# Patient Record
Sex: Female | Born: 1955 | Race: White | Hispanic: No | Marital: Married | Smoking: Never smoker
Health system: Southern US, Community
[De-identification: ages and names within clinical notes are randomized; demographics above are authoritative.]

## PROBLEM LIST (undated history)

## (undated) DIAGNOSIS — R011 Cardiac murmur, unspecified: Secondary | ICD-10-CM

## (undated) DIAGNOSIS — Z794 Long term (current) use of insulin: Secondary | ICD-10-CM

## (undated) DIAGNOSIS — J45909 Unspecified asthma, uncomplicated: Secondary | ICD-10-CM

## (undated) DIAGNOSIS — Z9884 Bariatric surgery status: Secondary | ICD-10-CM

## (undated) DIAGNOSIS — M1990 Unspecified osteoarthritis, unspecified site: Secondary | ICD-10-CM

## (undated) DIAGNOSIS — E119 Type 2 diabetes mellitus without complications: Secondary | ICD-10-CM

## (undated) DIAGNOSIS — R112 Nausea with vomiting, unspecified: Secondary | ICD-10-CM

## (undated) DIAGNOSIS — I10 Essential (primary) hypertension: Secondary | ICD-10-CM

## (undated) DIAGNOSIS — I8393 Asymptomatic varicose veins of bilateral lower extremities: Secondary | ICD-10-CM

## (undated) DIAGNOSIS — I341 Nonrheumatic mitral (valve) prolapse: Secondary | ICD-10-CM

## (undated) DIAGNOSIS — I872 Venous insufficiency (chronic) (peripheral): Secondary | ICD-10-CM

## (undated) DIAGNOSIS — E785 Hyperlipidemia, unspecified: Secondary | ICD-10-CM

## (undated) DIAGNOSIS — E039 Hypothyroidism, unspecified: Secondary | ICD-10-CM

## (undated) DIAGNOSIS — Z9889 Other specified postprocedural states: Secondary | ICD-10-CM

## (undated) HISTORY — DX: Nonrheumatic mitral (valve) prolapse: I34.1

## (undated) HISTORY — PX: KNEE ARTHROSCOPY: SUR90

## (undated) HISTORY — PX: TONSILLECTOMY: SUR1361

## (undated) HISTORY — PX: WISDOM TOOTH EXTRACTION: SHX21

## (undated) HISTORY — PX: ACHILLES TENDON REPAIR: SUR1153

## (undated) HISTORY — PX: CHOLECYSTECTOMY: SHX55

## (undated) HISTORY — PX: CATARACT EXTRACTION W/ INTRAOCULAR LENS  IMPLANT, BILATERAL: SHX1307

## (undated) HISTORY — DX: Cardiac murmur, unspecified: R01.1

## (undated) HISTORY — PX: COLONOSCOPY: SHX174

---

## 1986-10-20 HISTORY — PX: CARPAL TUNNEL RELEASE: SHX101

## 1998-02-20 ENCOUNTER — Other Ambulatory Visit: Admission: RE | Admit: 1998-02-20 | Discharge: 1998-02-20 | Payer: Self-pay | Admitting: Obstetrics & Gynecology

## 1998-10-20 HISTORY — PX: ROTATOR CUFF REPAIR: SHX139

## 1999-07-01 ENCOUNTER — Emergency Department (HOSPITAL_COMMUNITY): Admission: EM | Admit: 1999-07-01 | Discharge: 1999-07-01 | Payer: Self-pay | Admitting: Emergency Medicine

## 1999-07-02 ENCOUNTER — Emergency Department (HOSPITAL_COMMUNITY): Admission: EM | Admit: 1999-07-02 | Discharge: 1999-07-02 | Payer: Self-pay | Admitting: Emergency Medicine

## 1999-07-05 ENCOUNTER — Emergency Department (HOSPITAL_COMMUNITY): Admission: EM | Admit: 1999-07-05 | Discharge: 1999-07-05 | Payer: Self-pay | Admitting: Emergency Medicine

## 1999-07-08 ENCOUNTER — Emergency Department (HOSPITAL_COMMUNITY): Admission: EM | Admit: 1999-07-08 | Discharge: 1999-07-08 | Payer: Self-pay | Admitting: Emergency Medicine

## 1999-08-07 ENCOUNTER — Other Ambulatory Visit: Admission: RE | Admit: 1999-08-07 | Discharge: 1999-08-07 | Payer: Self-pay | Admitting: Obstetrics & Gynecology

## 2000-09-07 ENCOUNTER — Other Ambulatory Visit: Admission: RE | Admit: 2000-09-07 | Discharge: 2000-09-07 | Payer: Self-pay | Admitting: Obstetrics & Gynecology

## 2001-09-08 ENCOUNTER — Other Ambulatory Visit: Admission: RE | Admit: 2001-09-08 | Discharge: 2001-09-08 | Payer: Self-pay | Admitting: Obstetrics & Gynecology

## 2002-09-12 ENCOUNTER — Other Ambulatory Visit: Admission: RE | Admit: 2002-09-12 | Discharge: 2002-09-12 | Payer: Self-pay | Admitting: Obstetrics & Gynecology

## 2003-11-03 ENCOUNTER — Other Ambulatory Visit: Admission: RE | Admit: 2003-11-03 | Discharge: 2003-11-03 | Payer: Self-pay | Admitting: Obstetrics & Gynecology

## 2004-11-12 ENCOUNTER — Other Ambulatory Visit: Admission: RE | Admit: 2004-11-12 | Discharge: 2004-11-12 | Payer: Self-pay | Admitting: Obstetrics & Gynecology

## 2005-11-24 ENCOUNTER — Other Ambulatory Visit: Admission: RE | Admit: 2005-11-24 | Discharge: 2005-11-24 | Payer: Self-pay | Admitting: Obstetrics & Gynecology

## 2009-12-05 ENCOUNTER — Ambulatory Visit: Payer: Self-pay | Admitting: Sports Medicine

## 2009-12-05 DIAGNOSIS — M2170 Unequal limb length (acquired), unspecified site: Secondary | ICD-10-CM

## 2009-12-05 DIAGNOSIS — S93499A Sprain of other ligament of unspecified ankle, initial encounter: Secondary | ICD-10-CM

## 2009-12-05 DIAGNOSIS — M1710 Unilateral primary osteoarthritis, unspecified knee: Secondary | ICD-10-CM

## 2009-12-05 DIAGNOSIS — M7750 Other enthesopathy of unspecified foot: Secondary | ICD-10-CM | POA: Insufficient documentation

## 2009-12-05 DIAGNOSIS — S96819A Strain of other specified muscles and tendons at ankle and foot level, unspecified foot, initial encounter: Secondary | ICD-10-CM

## 2009-12-05 DIAGNOSIS — M216X9 Other acquired deformities of unspecified foot: Secondary | ICD-10-CM

## 2009-12-13 ENCOUNTER — Encounter (INDEPENDENT_AMBULATORY_CARE_PROVIDER_SITE_OTHER): Payer: Self-pay | Admitting: *Deleted

## 2009-12-25 ENCOUNTER — Ambulatory Visit: Payer: Self-pay | Admitting: Sports Medicine

## 2009-12-25 DIAGNOSIS — M25579 Pain in unspecified ankle and joints of unspecified foot: Secondary | ICD-10-CM

## 2010-01-23 ENCOUNTER — Ambulatory Visit: Payer: Self-pay | Admitting: Sports Medicine

## 2010-01-24 ENCOUNTER — Encounter (INDEPENDENT_AMBULATORY_CARE_PROVIDER_SITE_OTHER): Payer: Self-pay | Admitting: *Deleted

## 2010-01-28 ENCOUNTER — Encounter (INDEPENDENT_AMBULATORY_CARE_PROVIDER_SITE_OTHER): Payer: Self-pay | Admitting: *Deleted

## 2010-01-28 ENCOUNTER — Ambulatory Visit: Payer: Self-pay | Admitting: Internal Medicine

## 2010-02-04 ENCOUNTER — Ambulatory Visit: Payer: Self-pay | Admitting: Internal Medicine

## 2010-02-04 HISTORY — PX: COLONOSCOPY: SHX174

## 2010-02-21 ENCOUNTER — Ambulatory Visit: Payer: Self-pay | Admitting: Sports Medicine

## 2010-09-19 ENCOUNTER — Ambulatory Visit: Payer: Self-pay | Admitting: Sports Medicine

## 2010-09-19 DIAGNOSIS — M25529 Pain in unspecified elbow: Secondary | ICD-10-CM

## 2010-09-19 DIAGNOSIS — M7020 Olecranon bursitis, unspecified elbow: Secondary | ICD-10-CM

## 2010-10-21 ENCOUNTER — Ambulatory Visit: Payer: Self-pay | Admitting: Sports Medicine

## 2010-11-21 NOTE — Letter (Signed)
Summary: [facility] Instructions  [facility]  [address] [place], [place] [address]   Phone: [phone]  Fax: [phone]       Rebecca Camacho    12/08/1955    MRN: [id number]        Procedure Day /Date:  Monday 02/04/10     Arrival Time:  9:30am     Procedure Time:  10:30am     Location of Procedure:                    _X _  [facility] (4th Floor)                        PREPARATION FOR COLONOSCOPY WITH MOVIPREP   Starting 5 days prior to your procedure  Wednesday 04/13  do not eat nuts, seeds, popcorn, corn, beans, peas,  salads, or any raw vegetables.  Do not take any fiber supplements (e.g. Metamucil, Citrucel, and Benefiber).  THE DAY BEFORE YOUR PROCEDURE         DATE:  04/17   DAY:  Sunday  1.  Drink clear liquids the entire day-NO SOLID FOOD  2.  Do not drink anything colored red or purple.  Avoid juices with pulp.  No orange juice.  3.  Drink at least 64 oz. (8 glasses) of fluid/clear liquids during the day to prevent dehydration and help the prep work efficiently.  CLEAR LIQUIDS INCLUDE: Water Jello Ice Popsicles Tea (sugar ok, no milk/cream) Powdered fruit flavored drinks Coffee (sugar ok, no milk/cream) Gatorade Juice: apple, white grape, white cranberry  Lemonade Clear bullion, consomm, broth Carbonated beverages (any kind) Strained chicken noodle soup Hard Candy                             4.  In the morning, mix first dose of MoviPrep solution:    Empty 1 Pouch A and 1 Pouch B into the disposable container    Add lukewarm drinking water to the top line of the container. Mix to dissolve    Refrigerate (mixed solution should be used within 24 hrs)  5.  Begin drinking the prep at 5:00 p.m. The MoviPrep container is divided by 4 marks.   Every 15 minutes drink the solution down to the next mark (approximately 8 oz) until the full liter is complete.   6.  Follow completed prep with 16 oz of clear liquid of your  choice (Nothing red or purple).  Continue to drink clear liquids until bedtime.  7.  Before going to bed, mix second dose of MoviPrep solution:    Empty 1 Pouch A and 1 Pouch B into the disposable container    Add lukewarm drinking water to the top line of the container. Mix to dissolve    Refrigerate  THE DAY OF YOUR PROCEDURE      DATE:  04/18  DAY: Monday  Beginning at  5:30 a.m. (5 hours before procedure):         1. Every 15 minutes, drink the solution down to the next mark (approx 8 oz) until the full liter is complete.  2. Follow completed prep with 16 oz. of clear liquid of your choice.    3. You may drink clear liquids until  8:30am (2 HOURS BEFORE PROCEDURE).   MEDICATION INSTRUCTIONS  Unless otherwise instructed, you should take regular prescription medications with a small sip of  water   as early as possible the morning of your procedure.  Diabetic patients - see separate instructions.  Additional medication instructions: check your blood sugar often as needed         OTHER INSTRUCTIONS  You will need a responsible adult at least 57 years of age to accompany you and drive you home.   This person must remain in the waiting room during your procedure.  Wear loose fitting clothing that is easily removed.  Leave jewelry and other valuables at home.  However, you may wish to bring a book to read or  an iPod/MP3 player to listen to music as you wait for your procedure to start.  Remove all body piercing jewelry and leave at home.  Total time from sign-in until discharge is approximately 2-3 hours.  You should go home directly after your procedure and rest.  You can resume normal activities the  day after your procedure.  The day of your procedure you should not:   Drive   Make legal decisions   Operate machinery   Drink alcohol   Return to work  You will receive specific instructions about eating, activities and medications before you  leave.    The above instructions have been reviewed and explained to me by  Sherren Kerns RN  January 28, 2010 2:58 PM    I fully understand and can verbalize these instructions _____________________________ Date _________

## 2010-11-21 NOTE — Assessment & Plan Note (Signed)
Summary: F/U FEET PAIN,MC   Vital Signs:  Patient profile:   55 year old female BP sitting:   159 / 71  Vitals Entered By: Lillia Pauls CMA (December 25, 2009 2:37 PM)  CC:  f/u right foot pain.  History of Present Illness: Pt with hx of severe right knee DJD and bilateral achilles repairs, here for:  1.  right foot pain lateral  and plantar foot along peroneal distribution worse with prolonged standing and walking.  also worse on any uneven surface, such as walking over a threshold or steppping on a rock seen 2/18 and given a heel lift has been wearing heel lift also wears don joy brace on her knee when she is going to do any prolonged standing or walking not really any improvement since her last visit.       Allergies: No Known Drug Allergies  Review of Systems General:  Denies weakness. MS:  Denies loss of strength and stiffness.  Physical Exam  General:  Well-developed,well-nourished,overweight, in no acute distress; alert,appropriate and cooperative throughout examination Msk:  gait--functionally shorter right leg due to inability to extend right knee fully; pronation of right foot.  lifts 3rd and 4th toes up.    ANKLES/FEET: clawed toes on left ankles with from 4/5 right eversion strength vs 5/5 on left. Mild ttp distal right peroneal tendon.no swelling.  u/s:  very large achilles tendon on right bone spur at right 5th MT base with evidence of old avulsion fracture at attachment of peroneal tendon no bone spur at left mt base          Impression & Recommendations:  Problem # 1:  OTHER ACQUIRED DEFORMITY OF ANKLE AND FOOT OTHER (ICD-736.79) Assessment Unchanged  sizeable bone spur at 5th MT base probably due to old avulsion fracture.  Very large achilles tendon.  likely overusing peroneal tendons to compensate for weak achilles. fitted with air cast to help take pressure off of lateral right foot shown exercises to strengthen achilles, which should  help take some pressure off of lateral foot and peroneal tendon rtc 4 weeks  Orders: [place] LIMITED ([id number])  Problem # 2:  ACHILLES TENDON TEAR (ICD-845.09)  Orders: Airsport brace (J8119)  This is intact by [place] but probably alters her gait as it is markedly thickened on RT and LT On RT it is 1.37 cms  Problem # 3:  ANKLE PAIN (ICD-719.47)  Orders: Airsport brace (J4782) [place] LIMITED ([id number])  This is more clearly now related to old avulsion fx of RT foot and ankle that gives her peroneal sxs along lat ankle  Problem # 4:  DEGENERATIVE JOINT DISEASE, RIGHT KNEE (ICD-715.96) This is severe  at some point will need TKR  Patient Instructions: 1)  It was nice to see you today. 2)  Wear the brace we gave you. 3)  Do the exercises Dr Darrick Penna showed you (stand on a book and move heels up and down). 4)  Ice your foot at the end of the day. 5)  Please schedule a follow-up appointment in 4 weeks.

## 2010-11-21 NOTE — Miscellaneous (Signed)
Summary: previsit/rm  Clinical Lists Changes  Medications: Added new medication of MOVIPREP 100 GM  SOLR (PEG-KCL-NACL-NASULF-NA ASC-C) As per prep instructions. - Signed Rx of MOVIPREP 100 GM  SOLR (PEG-KCL-NACL-NASULF-NA ASC-C) As per prep instructions.;  #1 x 0;  Signed;  Entered by: Sherren Kerns RN;  Authorized by: Hilarie Fredrickson MD;  Method used: [facility] to [facility] [phone]*, [address], [place], [place], [place]  [id number], Ph: [phone] or [phone], Fax: [phone] Observations: Added new observation of ALLERGY REV: Done (01/28/2010 14:34)    Prescriptions: MOVIPREP 100 GM  SOLR (PEG-KCL-NACL-NASULF-NA ASC-C) As per prep instructions.  #1 x 0   Entered by:   Sherren Kerns RN   Authorized by:   Hilarie Fredrickson MD   Signed by:   Sherren Kerns RN on 01/28/2010   Method used:   [facility] to        [facility] [phone]* (retail)       [address]       [place], [place]  [id number]       Ph: [phone] or [phone]       Fax: [phone]   RxID:   [id number]

## 2010-11-21 NOTE — Assessment & Plan Note (Signed)
Summary: f/u,mc   Vital Signs:  Patient profile:   55 year old female BP sitting:   165 / 81  Vitals Entered By: Lillia Pauls CMA (Feb 21, 2010 2:02 PM)  History of Present Illness: Pt presents for follow-up for her right peroneal tendon injury. She has been wearing her aircast when she is out and about which has significantly decreased her pain. She is not having pain when she walks around at home without the aircast. She works at [employer] and does where it the entire time there because of the hard floors. She continues to take the Naproxen without any side effects. The Naproxen is helpful.  She has noticed some throbbing at night where the distal peroneal tendon inserts onto the 5th MT intermittently. She has not been icing or doing her exercises on a daily basis. However, overall she is doing much better than she did at her last office visit since using the aircast.   Allergies: No Known Drug Allergies  Physical Exam  General:  alert and well-developed.   Head:  normocephalic and atraumatic.   Neck:  supple.   Lungs:  normal respiratory effort.   Msk:  Right Ankle and Foot: No bony abnormalities, edema or bruising Full ROM without pain  Mild TTP at the base of the 5th MT No other TTP noticed 5/5 strength with resisted ankle ROM testing Able to bear weight easily  Left Ankle and Foot: No bony abnormalities, edema or bruising Full ROM without pain  No TTP throughout 5/5 strength with resisted ankle ROM testing Able to bear weight easily  Neurovascularly intact Additional Exam:  U/S of the right 5th MT shows a healing avulsion fracture at the side the of the insertion of the peroneal tendon into the base of the 5th MT. There is bone callus formation. Decreased doppler flow. Mild edema noted. No other fractures or abnormalities noted. Images saved for documentation.     Impression & Recommendations:  Problem # 1:  ANKLE PAIN (ICD-719.47)  Due to peroneal tendon  avulsion 1. Start to wean out of aircast as directed in patient instructions 2. Wean off Naproxen as tolerated 3. Return in 3-4 weeks for follow-up of fracture 4. Continue exercises and icing as previously directed  Orders: [place] LIMITED ([id number])  Complete Medication List: 1)  Moviprep 100 Gm Solr (Peg-kcl-nacl-nasulf-na asc-c) .... As per prep instructions.  Patient Instructions: 1)  Use your aircast when you are out and about and especially at work for the next month. However, start to wean yourself out of the brace. 2)  Start with one hour while outside and gradually increase by one hour every 2-3 days as tolerated. However, if your pain increases or your develop more throbbing, put your aircast back on more often.  3)  Continue the daily exercises for your calf muscle and ankle. 4)  Return in one month

## 2010-11-21 NOTE — Letter (Signed)
Summary: Previsit letter  [facility]  [address] [place], [place] [address]   Phone: [phone]  Fax: [phone]       12/13/2011 MRN: [id number]  Rebecca Camacho [address] [place], [place]  [address]  Dear [name],  Welcome to the Gastroenterology Division at [facility].    You are scheduled to see a nurse for your pre-procedure visit on 01-28-10 at 2:30p.m. on the 3rd floor at [facility], [address]  We ask that you try to arrive at our office 15 minutes prior to your appointment time to allow for check-in.  Your nurse visit will consist of discussing your medical and surgical history, your immediate family medical history, and your medications.    Please bring a complete list of all your medications or, if you prefer, bring the medication bottles and we will list them.  We will need to be aware of both prescribed and over the counter drugs.  We will need to know exact dosage information as well.  If you are on blood thinners (Coumadin, Plavix, Aggrenox, Ticlid, etc.) please call our office today/prior to your appointment, as we need to consult with your physician about holding your medication.   Please be prepared to read and sign documents such as consent forms, a financial agreement, and acknowledgement forms.  If necessary, and with your consent, a friend or relative is welcome to sit-in on the nurse visit with you.  Please bring your insurance card so that we may make a copy of it.  If your insurance requires a referral to see a specialist, please bring your referral form from your primary care physician.  No co-pay is required for this nurse visit.     If you cannot keep your appointment, please call [phone] to cancel or reschedule prior to your appointment date.  This allows [place] the opportunity to schedule an appointment for another patient in need of care.    Thank you for choosing [facility] for your  medical needs.  We appreciate the opportunity to care for you.  Please visit [place] at our website  to learn more about our practice.                     Sincerely.                                                                                                                   The Gastroenterology Division

## 2010-11-21 NOTE — Assessment & Plan Note (Signed)
Summary: FALL ON 11/25 - ELBOW PAIN/MJD   Vital Signs:  Patient profile:   55 year old female Height:      64 inches Weight:      220 pounds BMI:     37.90 BP sitting:   174 / 92  Vitals Entered By: Lillia Pauls CMA (September 19, 2010 2:36 PM)   History of Present Illness: 55 yo female, slipped and hit L elbow 9d ago.  Went to [facility], had XR that did not show fx.  Since then has tried naproxen with minimal benefit in her pain.  She has good strength to all movements but notes that she cannot get the elbow completely straight and has pain with activities that require wrist/finger flexion as well as pronation.  She has swelling and bruising.  No numbness/tingling in hand.    Preventive Screening-Counseling & Management  Alcohol-Tobacco     Smoking Status: never  Current Medications (verified): 1)  Mobic 7.5 Mg Tabs (Meloxicam) .... One To Two Tabs By Mouth Daily For Pain  Allergies (verified): No Known Drug Allergies  Social History: Smoking Status:  never  Review of Systems       SEe HPI  Physical Exam  General:  Well-developed,well-nourished,in no acute distress; alert,appropriate and cooperative throughout examination Msk:  Left elbow with bruising noted posteromedial from mid arm down to mid forearm.  Swelling over olecranon and medial epicondyle.  ROM approx 5 deg extension to 130 deg flexion.  Pronation/supination full ROM.  Strength full to flexion/extension, supination/pronation.  NVI distally.    Additional Exam:  MSK [place]: Images of medial epicondyle and olecranon taken.  Significant tissue edema seen in triceps tendon with traumatic olecranon bursitis.  Small avulsion fragment seen in triceps tendon adjacent to olecranon.  Medial epicondyle also with significant tissue edema.  Another avulsion fx fragment seen near this structure.  Common flexor origin tendons as well as triceps tendon appear intact. Images saved.   Impression & Recommendations:  Problem #  1:  ELBOW PAIN, LEFT (ICD-719.42) Assessment New  Symptoms and images suggestive of contusion, traumatic olecranon bursitis, and traumatic avulsion fracture of olecranon and medial epicondyle, likely too small to be seen on Xray. Recommend:  -elbow sleeve to be work during the day. -Ice massage 20 mins three times a day. -Change naproxen to Mobic. -ROM exercises with attempted full elbow extension 5x a day. -RTC 1 month to see how she's doing.  Orders: Garment,belt,sleeve or other covering ,elastic or similar stretch (Z6109) [place] LIMITED ([id number])  Problem # 2:  OLECRANON BURSITIS, LEFT (ICD-726.33)  This is post traumatic would not inject in face of avulsion fxs at this point if still painful in month - rescan and consider injection  Orders: [place] LIMITED (09811)  Complete Medication List: 1)  Mobic 7.5 Mg Tabs (Meloxicam) .... One to two tabs by mouth daily for pain  Patient Instructions: 1)  Elbow sleeve to be worn during the day. 2)  Mobic for pain, stop your naproxen for now.  3)  Range of motion exercises (stretch to full extension/straight elbow  ~5x a day) 4)  Come back to see [place] in 1 month to see how you are doing. 5)  -Dr. Karie Schwalbe. Prescriptions: MOBIC 7.5 MG TABS (MELOXICAM) One to two tabs by mouth daily for pain  #30 x 0   Entered by:   Rodney Langton MD   Authorized by:   Enid Baas MD   Signed by:   Rodney Langton MD  on 09/19/2010   Method used:   Print then Give to Patient   RxID:   [id number]    Orders Added: 1)  Garment,belt,sleeve or other covering ,elastic or similar stretch [A4466] 2)  [place] LIMITED [[id number]] 3)  Est. Patient Level III [14782]

## 2010-11-21 NOTE — Letter (Signed)
Summary: Diabetic Instructions  [facility]  [address] [place], [place] [address]   Phone: [phone]  Fax: [phone]    Rebecca Camacho 12-21-1955 MRN: [id number]   _x _   ORAL DIABETIC MEDICATION INSTRUCTIONS  The day before your procedure:   Take your diabetic pill as you do normally  The day of your procedure:   Do not take your diabetic pill    We will check your blood sugar levels during the admission process and again in Recovery before discharging you home  ________________________________________________________________________  _x _   INSULIN (LONG ACTING) MEDICATION INSTRUCTIONS (Lantus, NPH, 70/30, Humulin, Novolin-N)   The day before your procedure:   Take  your regular evening dose    The day of your procedure:   Do not take your morning dose    _x  _   INSULIN (SHORT ACTING) MEDICATION INSTRUCTIONS (Regular, Humulog, Novolog)   The day before your procedure:   Do not take your evening dose   The day of your procedure:   Do not take your morning dose

## 2010-11-21 NOTE — Procedures (Signed)
Summary: Colonoscopy  Patient: Rebecca Camacho Note: All result statuses are Final unless otherwise noted.  Tests: (1) Colonoscopy (COL)   COL Colonoscopy           DONE     [facility]     [address]     [place], [place]  [address]           COLONOSCOPY PROCEDURE REPORT           PATIENT:  Rebecca Camacho, Rebecca Camacho  MR#:  [id number]     BIRTHDATE:  Oct 07, 1956, 53 yrs. old  GENDER:  female     ENDOSCOPIST:  Wilhemina Bonito. Eda Keys, MD     REF. BY:  Jarome Matin, M.D.     PROCEDURE DATE:  02/04/2010     PROCEDURE:  Average-risk screening colonoscopy     G0121     ASA CLASS:  Class II     INDICATIONS:  Routine Risk Screening     MEDICATIONS:   Fentanyl 100 mcg IV, Versed 10 mg IV, Benadryl 25     mg IV           DESCRIPTION OF PROCEDURE:   After the risks benefits and     alternatives of the procedure were thoroughly explained, informed     consent was obtained.  Digital rectal exam was performed and     revealed no abnormalities.   The LB CF-[id number] endoscope     was introduced through the anus and advanced to the cecum, which     was identified by both the appendix and ileocecal valve, without     limitations.Time to cecum = 5:55 min.The quality of the prep was     excellent, using MoviPrep.  The instrument was then slowly     withdrawn (time = 12:57 min) as the colon was fully examined.     <<PROCEDUREIMAGES>>           FINDINGS:  Mild diverticulosis was found in the sigmoid colon.     This was otherwise a normal examination of the colon.  No polyps or     cancers were seen.   Retroflexed views in the rectum revealed no     abnormalities.    The scope was then withdrawn from the patient     and the procedure completed.           COMPLICATIONS:  None     ENDOSCOPIC IMPRESSION:     1) Mild diverticulosis in the sigmoid colon     2) Otherwise normal examination     3) No polyps or cancers     RECOMMENDATIONS:     1) Continue current colorectal screening  recommendations for     "routine risk" patients with a repeat colonoscopy in 10 years.           ______________________________     Wilhemina Bonito. Eda Keys, MD           CC:  Jarome Matin, MD;The Patient           n.     Rosalie DoctorWilhemina Bonito. Eda Keys at 02/04/2010 11:32 AM           [place], [place], [id number]  Note: An exclamation mark (!) indicates a result that was not dispersed into the flowsheet. Document Creation Date: 02/04/2010 11:33 AM _______________________________________________________________________  (1) Order result status: Final Collection or observation date-time: 02/04/2010 11:27 Requested date-time:  Receipt date-time:  Reported date-time:  Referring Physician:  Ordering Physician: Fransico Setters [id number]) Specimen Source:  Source: Launa Grill Order Number: [id number] Lab site:   Appended Document: Colonoscopy    Clinical Lists Changes  Observations: Added new observation of COLONNXTDUE: 01/2020 (02/04/2010 12:59)

## 2010-11-21 NOTE — Assessment & Plan Note (Signed)
Summary: NP FOOT PAIN/MJD   Vital Signs:  Patient profile:   55 year old female Height:      64 inches Weight:      210 pounds BMI:     36.18 BP sitting:   146 / 87  Vitals Entered By: Lillia Pauls CMA (December 05, 2009 11:40 AM)  History of Present Illness: 55 y.o. female with significant right knee DJD c/o right foot pain along distal peroneal distribution. Insidious onset in 08/2009. Worst on prolonged ambulation and eversion. Partly relieved by naproxen and rest. No swelling/numbness/tingling/discoloration. No ankle/foot trauma. Attributes pain to suspect change in gait following her right knee/achilles surgeries in 04/2009.  Severe right knee DJD s/p right knee scope in 04/2009. Limited extension and flexion which have not significantly improvement. Occasional buckling 2/2 pain. No locking/catching/swelling. Wears Don Joy brace which helps increase knee support. Received corticosteroid injection which helped decrease her pain. Wants to avoid TKA for as long as possible.        Allergies (verified): No Known Drug Allergies  Past History:  Past Surgical History: Repair of partial right AT tear performed by Dr. Lajoyce Corners in 04/2009. Right knee arthroscopy performed by Dr. Sherlean Foot in 04/2009.  Physical Exam  General:  Well-developed,well-nourished,in no acute distress; alert,appropriate and cooperative throughout examination Msk:  HIPS: FROM. 4/5 right flex/add/ER/abd strength. (+) right trendelenburg.  walking gait shows that right functions like a short leg due to her inability to get normal extension  KNEES: Right- Ext 10 deg deficit, Flex 100. 4/5 ext/flex. Left - Ext full, Flex 110-120. 5/5 ext/fex.  ANKLES/FEET: Significantly splayed 1st/2nd toes. Diffuse claw toeing on the right. Supinated 4th/5th toes bilaterally. Callous formation on balls of feet. Mild gross limitation on ROM testing of toes.  FROM of ankles. 4/5 right eversion strength vs 5/5  on left. Mild ttp distal right peroneal tendon. No bony ttp. No swelling/discoloration.  Excessive pronation (R>L) worsened on ambulation; controlled on insertion of comforthotics with right felt heel lift.        Pulses:  2+ pt/dp pulses. Extremities:  Functional leg length discrepancy as RLE  ~0.5cm shorter than LLE.  Relative ER of the RLE, most notable at the ankle & foot. Neurologic:  Sensation intact.   Impression & Recommendations:  Problem # 1:  OTHER ENTHESOPATHY OF ANKLE AND TARSUS (ICD-726.79)  - Comforthotics with right felt heel lift. - Continue to use DonJoy brace for ambulatory activities. - Hip strengthening exercises, without weights, as tolerated. - RTC as needed for persistent pain or other concerns.  This does lessen her trendelenburg and improve her walking in clinic to add the foot lift  Problem # 2:  DEGENERATIVE JOINT DISEASE, RIGHT KNEE (UEA-540.98) Mrs. Libman would like to avoid TKA for as long as possible. cont naprosyna nd tylenol prn  - Per item #1. - Continue aquatic exercises as tolerated.  Problem # 3:  UNEQUAL LEG LENGTH (ICD-736.81) Functional discrepancy 2/2 significant right knee DJD.  - Per item # 1.  Problem # 4:  ACHILLES TENDON TEAR (ICD-845.09)  - Maintain followup with Dr. Lajoyce Corners.

## 2010-11-21 NOTE — Assessment & Plan Note (Signed)
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   Msk:  RT ankle shows no swelling; stable lateral and medial ligaments; squeeze test and kleiger test unremarkable; talar dome seems nontender; no sign of peroneal tendon subluxations;  Now with light pressure there is no pain at base of 5th MT. able to do resistance exercises for peroneal MM sans pain  Bilat AT are thick w post surg changes neither are tender today   Impression & Recommendations:  Problem # 1:  OTHER ENTHESOPATHY OF ANKLE AND TARSUS (ICD-726.79) There is an avulsion fx at base of 5th but this does not appear tender today peroneal swelling is gone today and no real pain on testing  cont to sue brace over next mo  exercises started today for ankle and peroneals  reck in 1 mo and rescan at that time  Problem # 2:  ACHILLES TENDON TEAR (ICD-845.09) keep up AT exercises as perscribed  doing well at this point and gaining strength

## 2010-11-21 NOTE — Assessment & Plan Note (Signed)
Summary: f/u elbow,mc   Vital Signs:  Patient profile:   55 year old female BP sitting:   145 / 74  Vitals Entered By: Lillia Pauls CMA (October 21, 2010 11:50 AM)  History of Present Illness: Pt reports for f/u of lt elbow injury, which she reports is 50% improved.  Still has some slight soreness.  Swimming for exercise- some tightness with this.  Using naproxen daily for knee pain, and taking tylenol at night if elbow is sore- helpful for pain. meloxicam bothered her stomach  not doing any weight exercise yet  Allergies: No Known Drug Allergies  Physical Exam  General:  Well-developed,well-nourished,in no acute distress; alert,appropriate and cooperative throughout examination Msk:  Lacks 5 degrees full elbow extension on lt Full flexion bilat Full rotation both elbows bilat Good biceps strength bilat Triceps testing on L causes some pain  Additional Exam:  MSK [place] There is still fluid in olecranon bursa this is septated and in 3 sections note this is decreased from before avulsion fragment noted with no extensive swelling at insertin of tri tendon tendon intact   Impression & Recommendations:  Problem # 1:  ELBOW PAIN, LEFT (ICD-719.42)  Pain is much less  OK to cont on Naprosyn as needed  use compression sleeve ice at end of day  begin rehab exercises  Orders: [place] LIMITED ([id number])  Problem # 2:  OLECRANON BURSITIS, LEFT (ICD-726.33)  will use compression  will only inject if sxs get worse  reck 1 mo  Orders: [place] LIMITED ([id number])  Complete Medication List: 1)  Mobic 7.5 Mg Tabs (Meloxicam) .... One to two tabs by mouth daily for pain  Patient Instructions: 1)  Holding 1 pound weight do elbow extensions 6-10 reps, 3 sets- twice daily 2)  Wrist rolls 6-10 reps, 3 sets- twice daily  3)  swimming is ok 4)  Use compression sleeve with activity   Orders Added: 1)  Est. Patient Level III [29528] 2)  [place] LIMITED [[id number]]

## 2010-11-25 ENCOUNTER — Ambulatory Visit: Payer: Self-pay | Admitting: Sports Medicine

## 2012-07-19 DIAGNOSIS — I10 Essential (primary) hypertension: Secondary | ICD-10-CM | POA: Insufficient documentation

## 2012-08-20 DIAGNOSIS — Z9884 Bariatric surgery status: Secondary | ICD-10-CM

## 2012-08-20 HISTORY — DX: Bariatric surgery status: Z98.84

## 2012-08-20 HISTORY — PX: GASTRIC BYPASS: SHX52

## 2013-02-25 ENCOUNTER — Other Ambulatory Visit: Payer: Self-pay

## 2013-02-25 DIAGNOSIS — I83893 Varicose veins of bilateral lower extremities with other complications: Secondary | ICD-10-CM

## 2013-04-19 ENCOUNTER — Encounter: Payer: Self-pay | Admitting: Vascular Surgery

## 2013-04-26 ENCOUNTER — Encounter: Payer: Self-pay | Admitting: Vascular Surgery

## 2013-05-10 ENCOUNTER — Encounter: Payer: Self-pay | Admitting: Vascular Surgery

## 2013-05-11 ENCOUNTER — Encounter (INDEPENDENT_AMBULATORY_CARE_PROVIDER_SITE_OTHER): Payer: BC Managed Care – PPO | Admitting: *Deleted

## 2013-05-11 ENCOUNTER — Encounter: Payer: Self-pay | Admitting: Vascular Surgery

## 2013-05-11 ENCOUNTER — Ambulatory Visit (INDEPENDENT_AMBULATORY_CARE_PROVIDER_SITE_OTHER): Payer: BC Managed Care – PPO | Admitting: Vascular Surgery

## 2013-05-11 VITALS — BP 176/66 | HR 52 | Ht 64.0 in | Wt 139.7 lb

## 2013-05-11 DIAGNOSIS — I83893 Varicose veins of bilateral lower extremities with other complications: Secondary | ICD-10-CM

## 2013-05-11 DIAGNOSIS — Z01810 Encounter for preprocedural cardiovascular examination: Secondary | ICD-10-CM

## 2013-05-11 NOTE — Progress Notes (Signed)
[facility] [place]  Patient name: Rebecca Camacho MRN: [id number] DOB: 09-03-1956 Sex: female  REASON FOR CONSULT: evaluate painful varicose veins of both lower extremities.  HPI: [place] is a 57 y.o. female who has a long history of varicose veins. She's had pain in both lower extremities for many years related to her varicose veins. Did have some sclerotherapy in the thigh at some point in the past but does not remember the details. Currently she experiences aching pain burning pain and heaviness in both lower extremities which is associated with standing and relieved somewhat with elevation. He does not tolerate ibuprofen so can only take Tylenol for the pain. She works at [employer] and has to stand for a significant amount of time at work. Her symptoms are more significant on the right side. She is unaware of any history of DVT or phlebitis.  She does have a family history of varicose veins. Her father and also grandparents had varicose veins.  Past Medical History  Diagnosis Date  . Diabetes mellitus without complication    She has also undergone a gastric bypass for obesity and has lost 80 pounds.  Family History  Problem Relation Age of Onset  . Cancer Mother     lung  . Diabetes Father   . Heart disease Father   . Hyperlipidemia Father   . Hypertension Father   . Heart attack Father   . Other Father     varicose veins  . Hyperlipidemia Brother    SOCIAL HISTORY: History  Substance Use Topics  . Smoking status: Never Smoker   . Smokeless tobacco: Never Used  . Alcohol Use: No   No Known Allergies  Current Outpatient Prescriptions  Medication Sig Dispense Refill  . atorvastatin (LIPITOR) 40 MG tablet Take 1 tablet by mouth daily.      . Budesonide-Formoterol Fumarate (SYMBICORT IN) Inhale 1 puff into the lungs as needed.      . Calcium Carbonate-Vit D-Min (CALCIUM 1200 PO) Take 1 tablet by mouth daily.      . Cholecalciferol  (VITAMIN D3) 5000 UNITS CAPS Take by mouth.      . Cyanocobalamin (VITAMIN B12 PO) Take by mouth once a week.      . Insulin Glargine (LANTUS [place]) Inject 20 Units into the skin daily.      . IRON PO Take by mouth daily.      Marland Kitchen levothyroxine (SYNTHROID, LEVOTHROID) 50 MCG tablet Take 50 mcg by mouth daily before breakfast.      . losartan (COZAAR) 50 MG tablet Take 1 tablet by mouth daily.      . Multiple Vitamin (MULTIVITAMIN) tablet Take 1 tablet by mouth daily.       No current facility-administered medications for this visit.   REVIEW OF SYSTEMS: Arly.[email or web address] ] denotes positive finding; [  ] denotes negative finding  CARDIOVASCULAR:  [ ]  chest pain   [ ]  chest pressure   [ ]  palpitations   [ ]  orthopnea   [ ]  dyspnea on exertion   [ ]  claudication   [ ]  rest pain   [ ]  DVT   [ ]  phlebitis PULMONARY:   [ ]  productive cough   [ ]  asthma   [ ]  wheezing NEUROLOGIC:   [ ]  weakness  [ ]  paresthesias  [ ]  aphasia  [ ]  amaurosis  [ ]  dizziness HEMATOLOGIC:   [ ]  bleeding problems   [ ]  clotting disorders MUSCULOSKELETAL:  [ ]   joint pain   [ ]  joint swelling Arly.[email or web address] ] leg swelling GASTROINTESTINAL: [ ]   blood in stool  [ ]   hematemesis GENITOURINARY:  [ ]   dysuria  [ ]   hematuria PSYCHIATRIC:  [ ]  history of major depression INTEGUMENTARY:  [ ]  rashes  [ ]  ulcers CONSTITUTIONAL:  [ ]  fever   [ ]  chills  PHYSICAL EXAM: Filed Vitals:   05/11/13 1434  BP: 176/66  Pulse: 52  Height: 5\' 4"  (1.626 m)  Weight: 139 lb 11.2 oz (63.368 kg)  SpO2: 100%   Body mass index is 23.97 kg/(m^2). GENERAL: The patient is a well-nourished female, in no acute distress. The vital signs are documented above. CARDIOVASCULAR: There is a regular rate and rhythm. Do not detect carotid bruits. She has palpable popliteal and pedal pulses. He has mild bilateral lower extremity swelling. PULMONARY: There is good air exchange bilaterally without wheezing or rales. ABDOMEN: Soft and non-tender with normal pitched bowel sounds.   MUSCULOSKELETAL: There are no major deformities or cyanosis. NEUROLOGIC: No focal weakness or paresthesias are detected. SKIN: She has hyperpigmentation bilaterally consistent with chronic venous insufficiency. She has enlarged truncal varicosities along the anterior lateral aspect of both thighs. Exteriorly these varicosities continue on the posterior aspect of both calves. PSYCHIATRIC: The patient has a normal affect.  DATA:  I have independently interpreted her duplex scan today. She does have reflux in the greater saphenous vein in the right thigh. She also has reflux and a shorter segment of the greater saphenous vein in the left side.  MEDICAL ISSUES: This patient has painful varicose veins of both lower extremities related to incompetence of the proximal greater saphenous vein bilaterally. I have written her a prescription for thigh-high compression stockings with a gradient of 20-30 mm of mercury. She does not tolerate ibuprofen but will continue to take Tylenol. We have also discussed the importance of intermittent leg elevation and the proper positioning for this. If her symptoms do not improve she could potentially be considered for laser ablation of the greater saphenous veins. We will see her back as needed.   Freja Faro S [facility] [place]: [phone]

## 2013-06-15 DIAGNOSIS — R001 Bradycardia, unspecified: Secondary | ICD-10-CM | POA: Insufficient documentation

## 2014-09-08 DIAGNOSIS — Z9884 Bariatric surgery status: Secondary | ICD-10-CM | POA: Insufficient documentation

## 2014-09-08 DIAGNOSIS — E559 Vitamin D deficiency, unspecified: Secondary | ICD-10-CM | POA: Insufficient documentation

## 2015-04-03 ENCOUNTER — Encounter: Payer: Self-pay | Admitting: Internal Medicine

## 2015-06-19 ENCOUNTER — Encounter: Payer: Self-pay | Admitting: Family Medicine

## 2015-06-19 ENCOUNTER — Ambulatory Visit (INDEPENDENT_AMBULATORY_CARE_PROVIDER_SITE_OTHER): Payer: BLUE CROSS/BLUE SHIELD | Admitting: Family Medicine

## 2015-06-19 VITALS — BP 140/46 | Ht 64.0 in | Wt 145.0 lb

## 2015-06-19 DIAGNOSIS — M25572 Pain in left ankle and joints of left foot: Secondary | ICD-10-CM | POA: Diagnosis not present

## 2015-06-19 DIAGNOSIS — Q667 Congenital pes cavus, unspecified foot: Secondary | ICD-10-CM

## 2015-06-19 DIAGNOSIS — M21069 Valgus deformity, not elsewhere classified, unspecified knee: Secondary | ICD-10-CM | POA: Diagnosis not present

## 2015-06-19 DIAGNOSIS — M7672 Peroneal tendinitis, left leg: Secondary | ICD-10-CM

## 2015-06-19 DIAGNOSIS — M216X2 Other acquired deformities of left foot: Secondary | ICD-10-CM

## 2015-06-19 DIAGNOSIS — M21172 Varus deformity, not elsewhere classified, left ankle: Secondary | ICD-10-CM

## 2015-06-19 DIAGNOSIS — M7670 Peroneal tendinitis, unspecified leg: Secondary | ICD-10-CM | POA: Diagnosis not present

## 2015-06-19 MED ORDER — NITROGLYCERIN 0.2 MG/HR TD PT24: MEDICATED_PATCH | TRANSDERMAL | Status: DC

## 2015-06-19 NOTE — Progress Notes (Signed)
NAZLI PENN - 59 y.o. female MRN [id number]  Date of birth: December 19, 1955  CC: Left foot pain  SUBJECTIVE:   HPI  Left lateral foot pain: - 6 months, intermittent - pain when weight bearing.  - Has never taken any medication for it. Unable to take NSAIDs - Works at [employer] and does a lot of lateral motion. As she steps laterally to her left she has significant knee pain.   - Usually swims 1.5-2 miles/day.  Pain with push off of wall.  - No swelling.  - No trauma. - Always has foot pain.  - DJD of right knee   ROS:     14 point RoS negative in relation to right foot pain.   HISTORY: Past Medical, Surgical, Social, and Family History Reviewed & Updated per EMR.  Pertinent Historical Findings include: Diabetic (on insulin, no neuropathy). No migraines. DJD right knee.   OBJECTIVE: BP 140/46 mmHg  Ht  (1.626 m)  Wt 145 lb (65.772 kg)  BMI 24.88 kg/m2  Physical Exam  NAD, calm. Non-labored breathing. Speaking in full sentences.  Alignment: Genu valgus b/l. Hindfoot varus.   "too many toes" sign. Pes cavus.  Forefoot abduction/transverse arch collapse.   Gait: Minimal overpronation.   Ankle: left No visible erythema or swelling. Range of motion is full in all directions. Strength is 5/5 in all directions. Stable lateral and medial ligaments Talar dome nontender; No pain at base of 5th MT; No tenderness over cuboid; No tenderness on posterior aspects of lateral and medial malleolus No sign of peroneal tendon subluxations; Patient is significantly tender over the peroneal tubercle  Tender when ankle is forcibly everted.  Negative tarsal tunnel tinel's Achilles appears thickened b/l.  Residual scar present along both achilles.   Imaging: [place] image of the left lateral ankle in long and short axis obtained. Long axis shows separation of the peroneal tendons as they pass the peroneal tubercle. The peroneal tubercle appears sharp.  There is significant edema in both  peroneal tendons, but especially the peroneus longus.  Short axis view also shows increase edema surrounding both peroneal tendons.  L>B.  These findings suggests peroneal tendinitis.   MEDICATIONS, LABS & OTHER ORDERS: Previous Medications   ATORVASTATIN (LIPITOR) 40 MG TABLET    Take 1 tablet by mouth daily.   BUDESONIDE-FORMOTEROL FUMARATE (SYMBICORT IN)    Inhale 1 puff into the lungs as needed.   CALCIUM CARBONATE-VIT D-MIN (CALCIUM 1200 PO)    Take 1 tablet by mouth daily.   CHOLECALCIFEROL (VITAMIN D3) 5000 UNITS CAPS    Take by mouth.   CYANOCOBALAMIN (VITAMIN B12 PO)    Take by mouth once a week.   INSULIN GLARGINE (LANTUS [place])    Inject 20 Units into the skin daily.   IRON PO    Take by mouth daily.   LANTUS 100 UNIT/ML INJECTION       LEVOTHYROXINE (SYNTHROID, LEVOTHROID) 50 MCG TABLET    Take 50 mcg by mouth daily before breakfast.   LOSARTAN (COZAAR) 50 MG TABLET    Take 1 tablet by mouth daily.   MULTIPLE VITAMIN (MULTIVITAMIN) TABLET    Take 1 tablet by mouth daily.   ONE TOUCH ULTRA TEST TEST STRIP       ONETOUCH DELICA LANCETS FINE MISC       Modified Medications   No medications on file   New Prescriptions   No medications on file   Discontinued Medications   No medications on file  No orders of the defined types were placed in this encounter.   ASSESSMENT & PLAN: See problem based charting & AVS for pt instructions.

## 2015-06-19 NOTE — Patient Instructions (Signed)

## 2015-06-20 DIAGNOSIS — M21172 Varus deformity, not elsewhere classified, left ankle: Secondary | ICD-10-CM | POA: Insufficient documentation

## 2015-06-20 DIAGNOSIS — M7672 Peroneal tendinitis, left leg: Secondary | ICD-10-CM | POA: Insufficient documentation

## 2015-06-20 DIAGNOSIS — Q667 Congenital pes cavus, unspecified foot: Secondary | ICD-10-CM | POA: Insufficient documentation

## 2015-06-20 DIAGNOSIS — M216X2 Other acquired deformities of left foot: Secondary | ICD-10-CM | POA: Insufficient documentation

## 2015-06-20 DIAGNOSIS — M21069 Valgus deformity, not elsewhere classified, unspecified knee: Secondary | ICD-10-CM | POA: Insufficient documentation

## 2015-06-20 NOTE — Assessment & Plan Note (Addendum)
59 yo with years of various lower extremity pain. Currently with intermittent pain over the lateral hindfoot.  TTP over Peroneal tubercle with significant localized swelling visualized around the peroneal tendons (L>B).   - Nitro patch, 1/4 patch daily.   - Ankle compression sleeve provided.  - Achilles exercises in the externally rotated position.  - f/u 3 weeks.

## 2015-06-20 NOTE — Assessment & Plan Note (Signed)
Provided with insoles with scaphoid pads. F/u in 3 weeks for orthotics.

## 2015-07-24 ENCOUNTER — Encounter: Payer: Self-pay | Admitting: Family Medicine

## 2015-07-24 ENCOUNTER — Ambulatory Visit (INDEPENDENT_AMBULATORY_CARE_PROVIDER_SITE_OTHER): Payer: Managed Care, Other (non HMO) | Admitting: Family Medicine

## 2015-07-24 VITALS — BP 151/53 | Ht 64.0 in | Wt 140.0 lb

## 2015-07-24 DIAGNOSIS — M7670 Peroneal tendinitis, unspecified leg: Secondary | ICD-10-CM | POA: Diagnosis not present

## 2015-07-24 DIAGNOSIS — M7672 Peroneal tendinitis, left leg: Secondary | ICD-10-CM

## 2015-07-24 DIAGNOSIS — M25572 Pain in left ankle and joints of left foot: Secondary | ICD-10-CM

## 2015-07-24 DIAGNOSIS — Q667 Congenital pes cavus, unspecified foot: Secondary | ICD-10-CM

## 2015-07-24 MED ORDER — NITROGLYCERIN 0.2 MG/HR TD PT24: MEDICATED_PATCH | TRANSDERMAL | Status: DC

## 2015-07-25 NOTE — Progress Notes (Signed)
Rebecca Camacho - 59 y.o. female MRN [id number]  Date of birth: 11-30-1955  CC: Left Peroneal tendinitis  SUBJECTIVE:   HPI Left lateral foot pain diagnosed as peroneal tendinopathy at last visit with u/s. : - 7 months, intermittent. No change since last visit.  - Pain when weight bearing, especially with lateral motion. - Has been using 1/4 nitro patch daily as well as ankle compression sleeve.  - In addition to normal work out routine she is doing achilles exercises externally rotated.  - She has also been using her scaphoid pads her shoes at home, but not at work.  She is here to get orthotics today.   - Has never taken any medication for it. Unable to take NSAIDs due to GI distress - Works at [employer] and does a lot of lateral motion. As she steps laterally to her left she has significant knee pain.  - Usually swims 1.5-2 miles/day. Pain pushing off of wall.  - Again no swelling or trauma  Denies fevers, chills or night sweats.   ROS:    14 point RoS negative in relation to right foot pain.   HISTORY: Past Medical, Surgical, Social, and Family History Reviewed & Updated per EMR.    OBJECTIVE: BP 151/53 mmHg  Ht  (1.626 m)  Wt 140 lb (63.504 kg)  BMI 24.02 kg/m2  Physical Exam  NAD, calm. Non-labored breathing. Speaking in full sentences.  Alignment: Genu valgus b/l. Hindfoot varus. "too many toes" sign. Pes cavus. Forefoot abduction/transverse arch collapse.  Gait: Minimal overpronation. This seems to be corrected with the orthotics.   Ankle: left No visible erythema or swelling. Range of motion is full in all directions. Strength is 5/5 in all directions. Pain with resisted eversion.  Stable lateral and medial ligaments Talar dome nontender; No pain at base of 5th MT; No tenderness over cuboid; No tenderness on posterior aspects of lateral and medial malleolus No sign of peroneal tendon subluxations; Patient is significantly tender over the peroneal  tubercle  Tender when ankle is forcibly everted.  Negative tarsal tunnel tinel's Achilles appears thickened b/l. Residual scar present along both achilles.   MEDICATIONS, LABS & OTHER ORDERS: Previous Medications   ATORVASTATIN (LIPITOR) 40 MG TABLET    Take 1 tablet by mouth daily.   BUDESONIDE-FORMOTEROL FUMARATE (SYMBICORT IN)    Inhale 1 puff into the lungs as needed.   CALCIUM CARBONATE-VIT D-MIN (CALCIUM 1200 PO)    Take 1 tablet by mouth daily.   CHOLECALCIFEROL (VITAMIN D3) 5000 UNITS CAPS    Take by mouth.   CYANOCOBALAMIN (VITAMIN B12 PO)    Take by mouth once a week.   FLUVIRIN PRESERVATIVE FREE 0.5 ML SUSY    ADM 0.5ML IM UTD   INSULIN GLARGINE (LANTUS [place])    Inject 20 Units into the skin daily.   IRON PO    Take by mouth daily.   LANTUS 100 UNIT/ML INJECTION       LEVOTHYROXINE (SYNTHROID, LEVOTHROID) 50 MCG TABLET    Take 50 mcg by mouth daily before breakfast.   LOSARTAN (COZAAR) 50 MG TABLET    Take 1 tablet by mouth daily.   MULTIPLE VITAMIN (MULTIVITAMIN) TABLET    Take 1 tablet by mouth daily.   ONE TOUCH ULTRA TEST TEST STRIP       ONETOUCH DELICA LANCETS FINE MISC       Modified Medications   Modified Medication Previous Medication   NITROGLYCERIN (NITRODUR - DOSED IN MG/24 HR)  0.2 MG/HR PATCH nitroGLYCERIN (NITRODUR - DOSED IN MG/24 HR) 0.2 mg/hr patch      Place 1/2 patch to the affected area daily    Use 1/4 patch daily to the affected area   New Prescriptions   No medications on file   Discontinued Medications   No medications on file   Orders Placed This Encounter  Procedures  . Ambulatory referral to Physical Therapy   ASSESSMENT & PLAN: See problem based charting & AVS for pt instructions.  Patient was fitted for a : standard, cushioned, semi-rigid orthotic. The orthotic was heated and afterward the patient stood on the orthotic blank positioned on the orthotic stand. The patient was positioned in subtalar neutral position and 10 degrees of  ankle dorsiflexion in a weight bearing stance. After completion of molding, a stable base was applied to the orthotic blank. The blank was ground to a stable position for weight bearing. Size: 8 Base: Blue EVA Posting:none Additional orthotic padding:none  40 minutes were spent (> 1/2 of which was face to face) with the patient discussing, constructing, and adjusting the orthotics.

## 2015-07-25 NOTE — Assessment & Plan Note (Signed)
59 yo with many years of lower extremity pain, most prominent over the left lateral hindfoot, found to peroneal tendinopathy at the last visit. Minimal improvmement with 1/4 nitro patch, ankle compression sleeve, and achilles exercises.  Also had scaphoid pads, but did not wear to work, only at home.  She did think these helped.  - Increased Nitro to 1/2 patch daily.  No symptoms with 1/4 patch - Will try lace up ASO while at work.  She had significant discomfort trying to stabilize her foot on the orthotic stand.  - Wear orthotics while at work.  - f/u 3 weeks for u/s

## 2015-08-21 ENCOUNTER — Encounter: Payer: Self-pay | Admitting: Family Medicine

## 2015-08-21 ENCOUNTER — Ambulatory Visit (INDEPENDENT_AMBULATORY_CARE_PROVIDER_SITE_OTHER): Payer: Managed Care, Other (non HMO) | Admitting: Family Medicine

## 2015-08-21 ENCOUNTER — Ambulatory Visit
Admission: RE | Admit: 2015-08-21 | Discharge: 2015-08-21 | Disposition: A | Discharge status: Home / Self Care | Payer: Managed Care, Other (non HMO) | Source: Ambulatory Visit | Attending: Family Medicine | Admitting: Family Medicine

## 2015-08-21 VITALS — BP 126/46 | HR 56 | Ht 64.0 in | Wt 140.0 lb

## 2015-08-21 DIAGNOSIS — M79672 Pain in left foot: Secondary | ICD-10-CM

## 2015-08-21 DIAGNOSIS — M7670 Peroneal tendinitis, unspecified leg: Secondary | ICD-10-CM

## 2015-08-21 DIAGNOSIS — M7672 Peroneal tendinitis, left leg: Secondary | ICD-10-CM

## 2015-08-21 IMAGING — CR DG FOOT COMPLETE 3+V*L*
3 series · 3 of 3 positions shown · non-contrast
Comparison: None in PACs

CLINICAL DATA: [DATE] month history of pain in left fifth metatarsal
without known injury. The patient is an active walker and swimmer.

EXAM:
LEFT FOOT - COMPLETE 3+ VIEW

[t foot ap left]
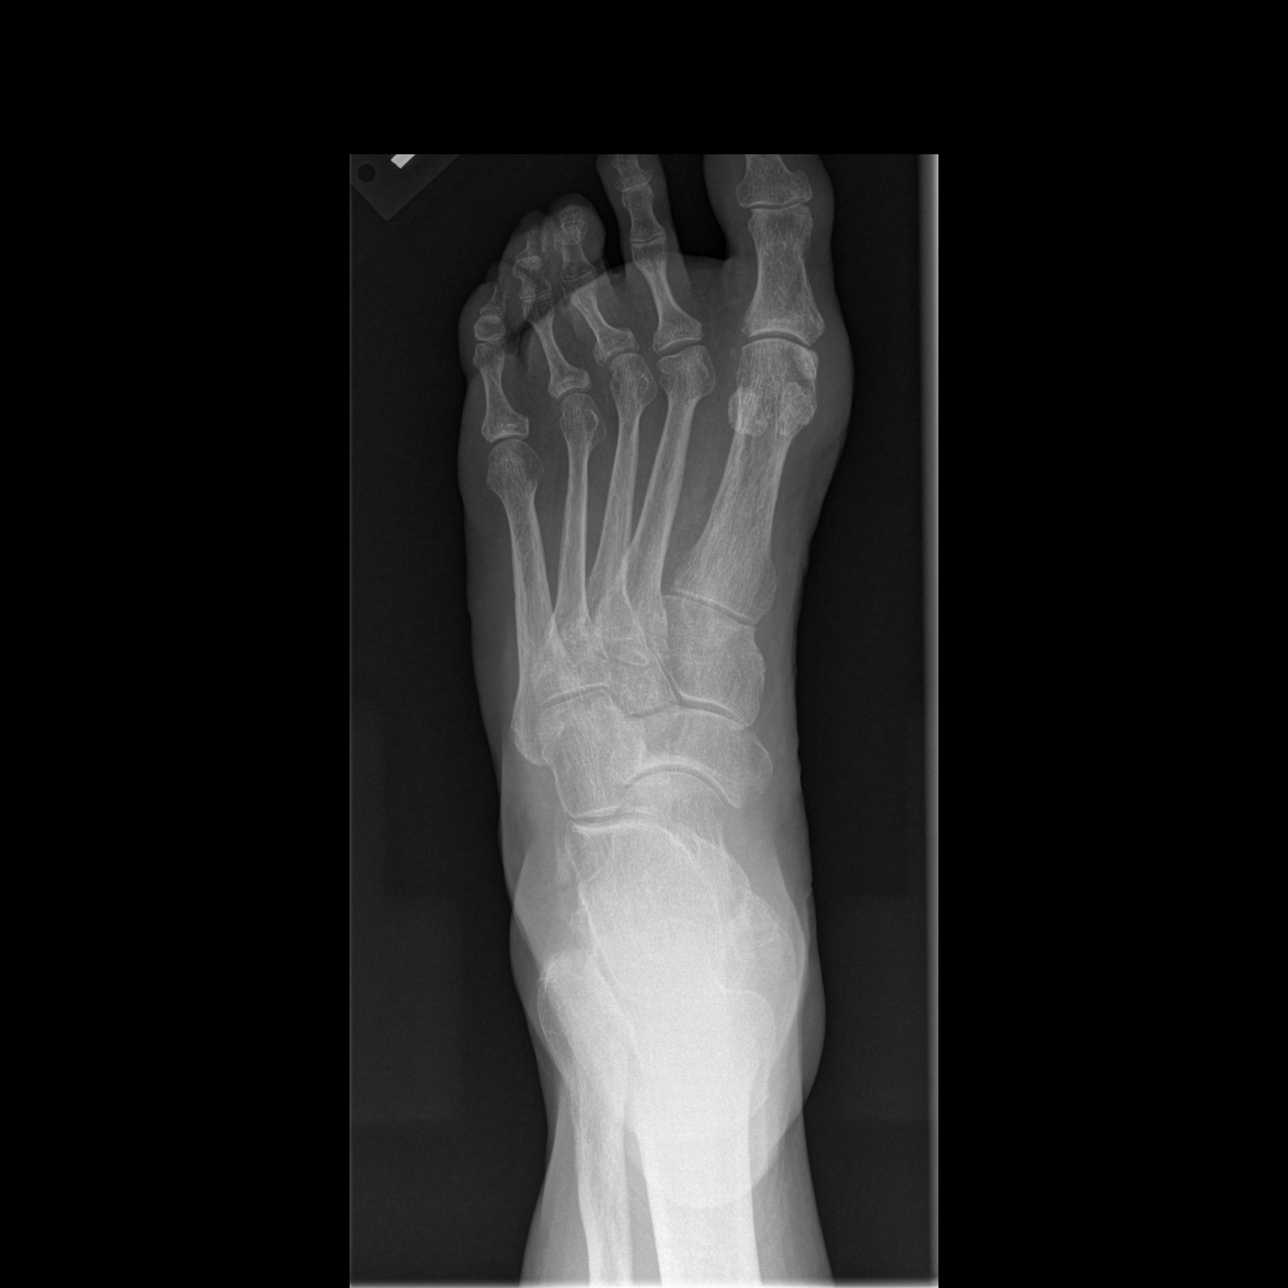

[t foot oblique left]
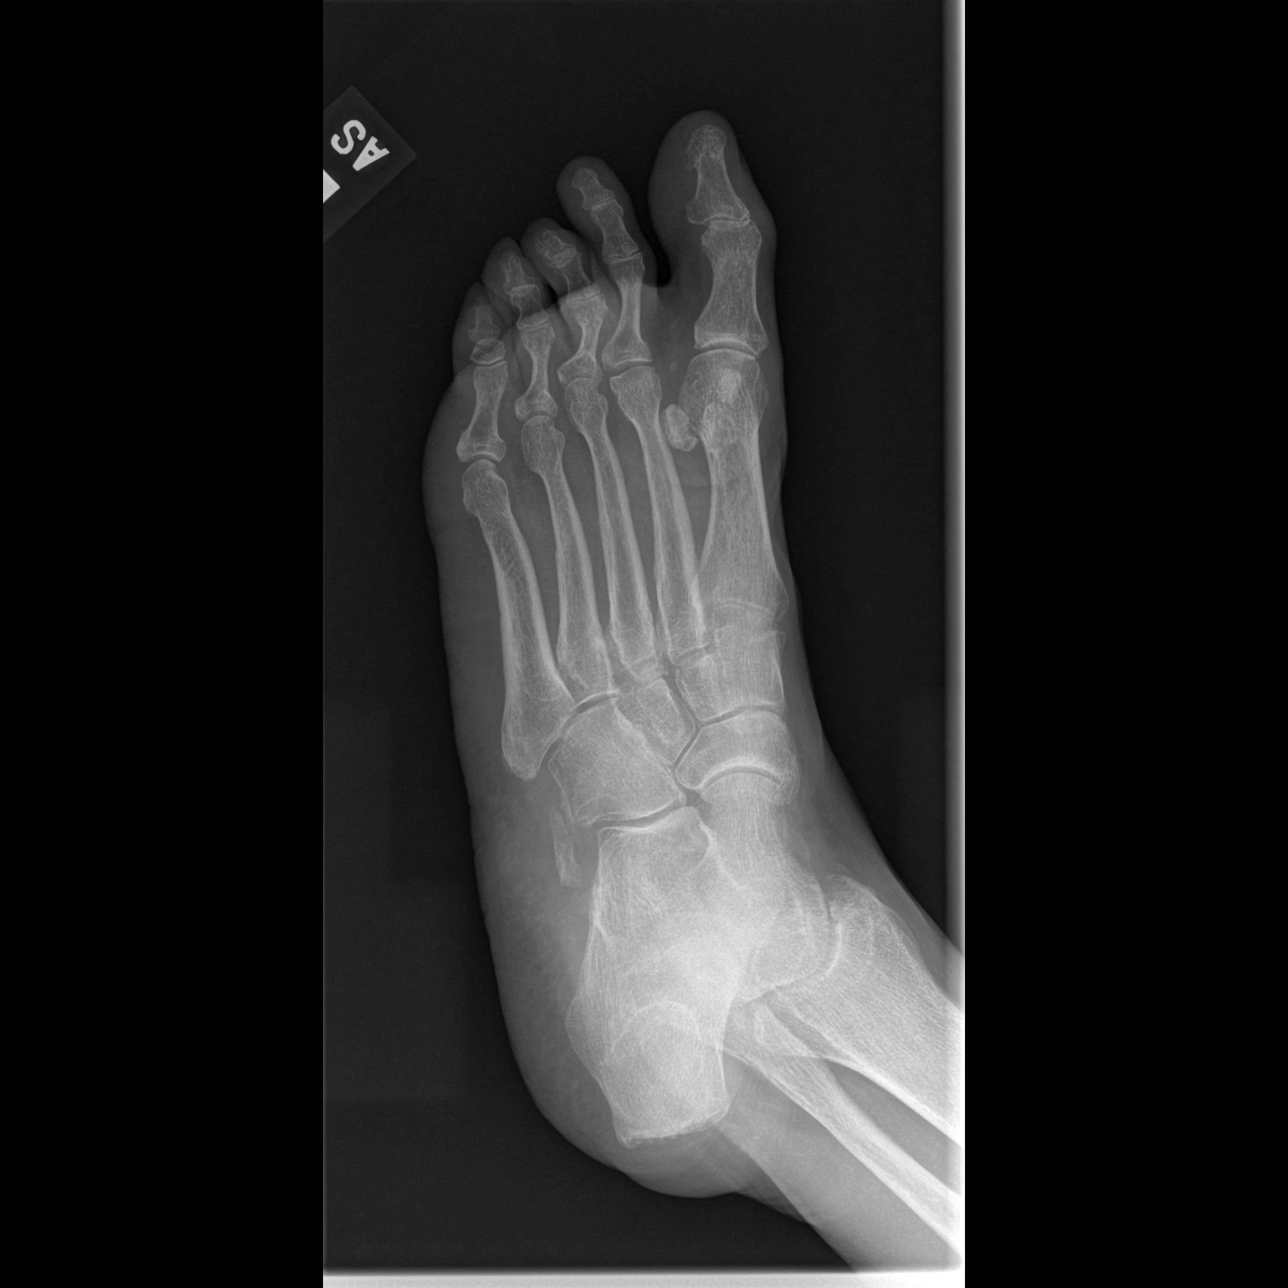

[t foot lat left]
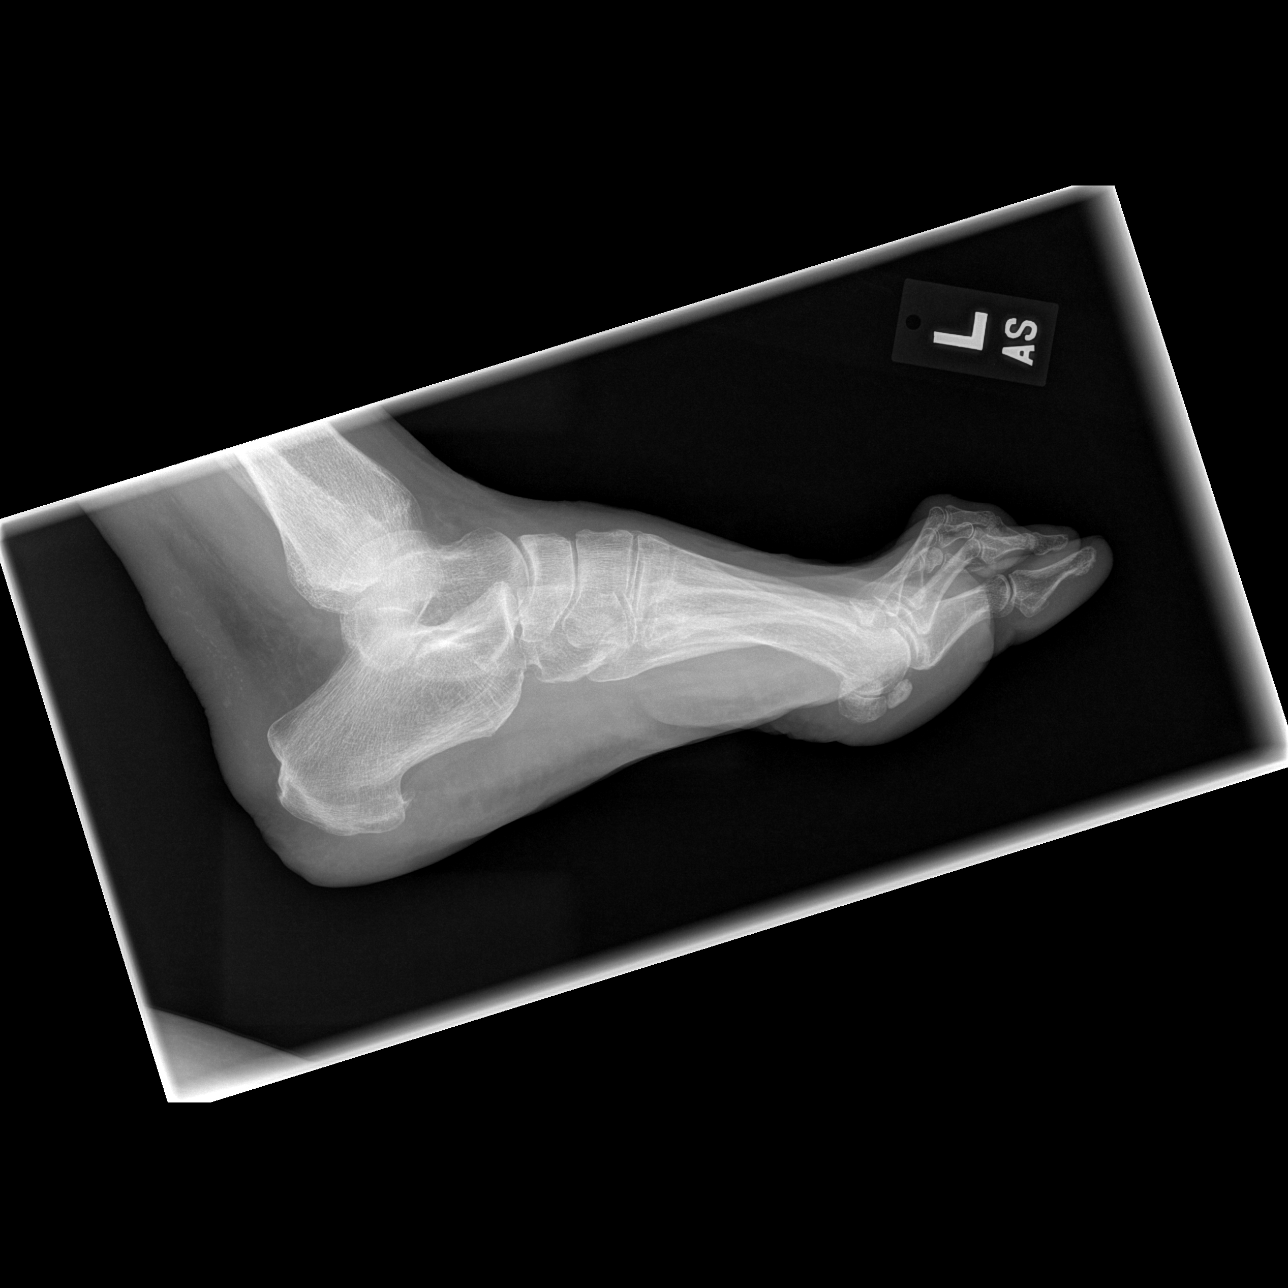

[3 of 3 positions shown; findings below may reference images not displayed]

FINDINGS: The bones of the left foot are adequately mineralized. Specific
attention to the fifth metatarsal reveals no acute fracture nor
evidence of previous injury. There is bony density adjacent to the
tarsal cuboid consistent with a prominent sesamoid. There is a
plantar calcaneal spur. There is flattening of the head of the
second metatarsal. The soft tissues of the foot are unremarkable.
IMPRESSION: There is no acute bony abnormality of the left foot. Specific
attention to the fifth ray reveals no significant abnormality. There
is flattening of the head of the second metatarsal consistent with
degenerative change. If the patient's symptoms persist and remain
unexplained, further evaluation with MRI may be useful.

## 2015-08-21 NOTE — Progress Notes (Signed)
Rebecca SouthwardVirginia R Mabey - 59 y.o. female MRN [id number]  Date of birth: Sep 27, 1956  CC: Left lateral ankle pain.   SUBJECTIVE:   HPI  Left Peroneal tendinopathy: - Now 8 months duration, 2 months of treatment - 1/2 patch of nitro - Not doing exercises correctly  - Rehab is too expensive - Still working at [employer] and swimming  - Orthotics that were fitted last month help overall foot pain, but have not decreased her lateral foot discomfort.  - Overall the pain does not limit her day to day life.   ROS:     14 point RoS negative in relation to right foot pain.    HISTORY: Past Medical, Surgical, Social, and Family History Reviewed & Updated per EMR.    OBJECTIVE: BP 126/46 mmHg  Pulse 56  Ht 5\' 4"  (1.626 m)  Wt 140 lb (63.504 kg)  BMI 24.02 kg/m2  Physical Exam  NAD, calm. Non-labored breathing. Speaking in full sentences.  Alignment: Genu valgus b/l. Hindfoot varus. "too many toes" sign. Pes cavus. Forefoot abduction/transverse arch collapse.  Gait: Minimal overpronation. This seems to be corrected with the orthotics.   Ankle: left No visible erythema or swelling. Range of motion is full in all directions. Strength is 5/5 in all directions. No pain with resisted eversion.  Stable lateral and medial ligaments Talar dome nontender; No pain at base of 5th MT; No tenderness over cuboid; No tenderness on posterior aspects of lateral and medial malleolus No sign of peroneal tendon subluxations; Patient is significantly tender over the peroneal tubercle   Negative tarsal tunnel tinel's Achilles appears thickened b/l. Residual scar present along both achilles from partial repair.   U/S: The lateral foot was evaluated in long and short axis.  The peroneal tubercle is again noted to be extremely prominent. There seems to be increased fluid surrounding the peroneal tendons.   MEDICATIONS, LABS & OTHER ORDERS: Previous Medications   ATORVASTATIN (LIPITOR) 40 MG TABLET     Take 1 tablet by mouth daily.   BUDESONIDE-FORMOTEROL FUMARATE (SYMBICORT IN)    Inhale 1 puff into the lungs as needed.   CALCIUM CARBONATE-VIT D-MIN (CALCIUM 1200 PO)    Take 1 tablet by mouth daily.   CHOLECALCIFEROL (VITAMIN D3) 5000 UNITS CAPS    Take by mouth.   CYANOCOBALAMIN (VITAMIN B12 PO)    Take by mouth once a week.   FLUVIRIN PRESERVATIVE FREE 0.5 ML SUSY    ADM 0.5ML IM UTD   INSULIN GLARGINE (LANTUS [place])    Inject 20 Units into the skin daily.   IRON PO    Take by mouth daily.   LANTUS 100 UNIT/ML INJECTION       LEVOTHYROXINE (SYNTHROID, LEVOTHROID) 50 MCG TABLET    Take 50 mcg by mouth daily before breakfast.   LOSARTAN (COZAAR) 50 MG TABLET    Take 1 tablet by mouth daily.   MULTIPLE VITAMIN (MULTIVITAMIN) TABLET    Take 1 tablet by mouth daily.   NITROGLYCERIN (NITRODUR - DOSED IN MG/24 HR) 0.2 MG/HR PATCH    Place 1/2 patch to the affected area daily   ONE TOUCH ULTRA TEST TEST STRIP       ONETOUCH DELICA LANCETS FINE MISC       Modified Medications   No medications on file   New Prescriptions   No medications on file   Discontinued Medications   No medications on file  No orders of the defined types were placed in this encounter.  ASSESSMENT & PLAN: See problem based charting & AVS for pt instructions.

## 2015-08-22 NOTE — Assessment & Plan Note (Addendum)
59 yo with ~ 1 year of foot pain, being treated for 2 months. Found to have peroneal tendinopathy. Pes cavus.   - Continue ankle compression sleeve as it helps more than the ASO.  - Continue nitro 1/2 patch daily - Adjusted orthotics to relieve some work off of the peroneals by adding a lateral heel wedge.  - Instructed on correct heel drop exercises with lateral heel rotation.  She was not doing these before.  -XR to r/o any bony abnormality - f/u 3-4 weeks.

## 2015-09-18 ENCOUNTER — Encounter: Payer: Self-pay | Admitting: Family Medicine

## 2015-09-18 ENCOUNTER — Ambulatory Visit (INDEPENDENT_AMBULATORY_CARE_PROVIDER_SITE_OTHER): Payer: Managed Care, Other (non HMO) | Admitting: Family Medicine

## 2015-09-18 VITALS — BP 138/76 | Ht 64.0 in | Wt 140.0 lb

## 2015-09-18 DIAGNOSIS — M7670 Peroneal tendinitis, unspecified leg: Secondary | ICD-10-CM | POA: Diagnosis not present

## 2015-09-18 DIAGNOSIS — M1710 Unilateral primary osteoarthritis, unspecified knee: Secondary | ICD-10-CM

## 2015-09-18 DIAGNOSIS — M7672 Peroneal tendinitis, left leg: Secondary | ICD-10-CM

## 2015-09-18 DIAGNOSIS — M179 Osteoarthritis of knee, unspecified: Secondary | ICD-10-CM | POA: Diagnosis not present

## 2015-09-18 DIAGNOSIS — IMO0002 Reserved for concepts with insufficient information to code with codable children: Secondary | ICD-10-CM

## 2015-09-18 NOTE — Progress Notes (Signed)
Rebecca SouthwardVirginia R Alberson - 59 y.o. female MRN [id number]  Date of birth: [birth year]  CC: Left Ankle pain  SUBJECTIVE:   HPI  Left Peroneal tendinopathy: - Now 9 months duration, 3 months of treatment with 1/2 nitro - 1/2 patch of nitro - Has been doing exercises correctly for 1 month.  - Rehab is too expensive - Still working at [employer] and swimming. She has modified her swim push off on the left.  - Orthotics help overall foot pain, but have not decreased her lateral ankle discomfort.  - Within the last month she is doing better.  Difficult to quantify. . Mostly bothering when hit by dog or near end of work shift with the amount of lateral movement she has to do.  -No pain medications.   Right knee has been bothering her off and on for years.  Minimal pain, but occasional tightness. Unable to take NSAIDs.  Walking dog hurts at times.  Knows she has significant degeneration from XR at orthopedist in the past. No night time pain.  Pain getting up in the AM and descending stairs.   ROS:     14 point RoS negative in relation to right foot pain.   HISTORY: Past Medical, Surgical, Social, and Family History Reviewed & Updated per EMR.    OBJECTIVE: BP 138/76 mmHg  Ht 5\' 4"  (1.626 m)  Wt 140 lb (63.504 kg)  BMI 24.02 kg/m2  Physical Exam  NAD, calm. Non-labored breathing. Speaking in full sentences.  Alignment: Genu valgus b/l. Hindfoot varus."too many toes" sign. Pes cavus. Forefoot abduction/transverse arch collapse.   Ankle: left No visible erythema or swelling. Range of motion is full in all directions. Strength is 5/5 in all directions. No pain with resisted eversion.  Stable lateral and medial ligaments Talar dome nontender; No pain at base of 5th MT; No tenderness over cuboid; No tenderness on posterior aspects of lateral and medial malleolus No sign of peroneal tendon subluxations; Patient is significantly tender over the peroneal tubercle on the left. Seems less tender  than last visit.  Achilles appears thickened b/l. Residual scar present along both achilles from partial repair.   Knee: right.  Normal to inspection with no erythema or effusion or obvious bony abnormalities. Palpation normal with minimal joint line tenderness and no patellar tenderness or condyle tenderness. ROM limited in flexion and extension. Pain with terminal extension and flexion, end points at 5-100 degrees.  Ligamentously intact.  Non painful patellar compression. Some crepitus.  Patellar and quadriceps tendons unremarkable. Hamstring and quadriceps strength is normal. .  MEDICATIONS, LABS & OTHER ORDERS: Previous Medications   ALBUTEROL (PROVENTIL HFA) 108 (90 BASE) MCG/ACT INHALER    Inhale 1 puff into the lungs.   BUDESONIDE-FORMOTEROL FUMARATE (SYMBICORT IN)    Inhale 1 puff into the lungs as needed.   CALCIUM CARBONATE-VIT D-MIN (CALCIUM 1200 PO)    Take 1 tablet by mouth daily.   CHOLECALCIFEROL (VITAMIN D3) 5000 UNITS CAPS    Take by mouth.   CYANOCOBALAMIN (VITAMIN B12 PO)    Take by mouth once a week.   FLUVIRIN PRESERVATIVE FREE 0.5 ML SUSY    ADM 0.5ML IM UTD   INSULIN GLARGINE (LANTUS [place])    Inject 20 Units into the skin daily.   IRON PO    Take by mouth daily.   LANTUS 100 UNIT/ML INJECTION       LEVOTHYROXINE (SYNTHROID, LEVOTHROID) 150 MCG TABLET    Take 150 mcg by mouth daily before breakfast.  LOSARTAN (COZAAR) 50 MG TABLET    Take 1 tablet by mouth daily.   METRONIDAZOLE (METROGEL) 1 % GEL    Apply topically.   MULTIPLE VITAMIN (MULTIVITAMIN) TABLET    Take 1 tablet by mouth daily.   NITROGLYCERIN (NITRODUR - DOSED IN MG/24 HR) 0.2 MG/HR PATCH    Place 1/2 patch to the affected area daily   ONE TOUCH ULTRA TEST TEST STRIP       ONETOUCH DELICA LANCETS FINE MISC       PRAVASTATIN (PRAVACHOL) 40 MG TABLET       Modified Medications   No medications on file   New Prescriptions   No medications on file   Discontinued Medications   ATORVASTATIN  (LIPITOR) 40 MG TABLET    Take 1 tablet by mouth daily.   LEVOTHYROXINE (SYNTHROID, LEVOTHROID) 50 MCG TABLET    Take 50 mcg by mouth daily before breakfast.  No orders of the defined types were placed in this encounter.   ASSESSMENT & PLAN: See problem based charting & AVS for pt instructions.

## 2015-09-19 NOTE — Assessment & Plan Note (Addendum)
Knee has been bothering her more lately.  Diagnosed with OA in the past. No acute injury, but just notices stiffness and reduced RoM randomly.  No night time pain. Has had a compression brace in the past, which has helped.  She would like a new one today.  We recommended a knee sleeve with a patellar cut out, which we did not have on hand for her. She will order one.  A large amount of her pain seems PF. Additionally, we have given her knee stabilizer strengthening exercises.  Consider injection in the future, which she is not ready for.

## 2015-09-19 NOTE — Assessment & Plan Note (Signed)
59 yo with ~1 year of foot pain.  1/2 nitro patch for last 3 months and heel drop exercises for the last month. Overall improving.  XR negative for fracture.   - Pes cavus. Has orthotics.  - Continue ankle sleeve as this is helping.   - Continue lateral heel wedge to help remove tension off of peroneal tendons.    - f/u 6-8 weeks.

## 2018-03-17 ENCOUNTER — Encounter (HOSPITAL_COMMUNITY): Payer: Self-pay | Admitting: *Deleted

## 2018-03-17 NOTE — H&P (Addendum)
TOTAL KNEE ADMISSION H&P  Patient is being admitted for right total knee arthroplasty.  Subjective:  Chief Complaint:right knee pain.  HPI: [place], 62 y.o. female, has a history of pain and functional disability in the right knee due to arthritis and has failed non-surgical conservative treatments for greater than 12 weeks to includeNSAID's and/or analgesics, corticosteriod injections and activity modification.  Onset of symptoms was gradual, starting 1+ years ago with gradually worsening course since that time. The patient noted prior procedures on the knee to include  arthroscopy on the right knee(s).  Patient currently rates pain in the right knee(s) at 9 out of 10 with activity. Patient has night pain, worsening of pain with activity and weight bearing and pain that interferes with activities of daily living.  Patient has evidence of joint space narrowing by imaging studies. . There is no active infection. PCP-Dr Ivery Quale Cardio-none  Patient Active Problem List   Diagnosis Date Noted  . Peroneal tendinitis of left lower leg 06/20/2015  . 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 Acquired left hindfoot varus 06/20/2015  . Varicose veins of lower extremities with other complications 05/11/2013  . ELBOW PAIN, LEFT 09/19/2010  . OLECRANON BURSITIS, LEFT 09/19/2010  . ANKLE PAIN 12/25/2009  . Osteoarthrosis involving lower leg 12/05/2009  . OTHER ENTHESOPATHY OF ANKLE AND TARSUS 12/05/2009  . OTHER ACQUIRED DEFORMITY OF ANKLE AND FOOT OTHER 12/05/2009  . UNEQUAL LEG LENGTH 12/05/2009  . ACHILLES TENDON TEAR 12/05/2009   Past Medical History:  Diagnosis Date  . Diabetes mellitus without complication [facility])     Past Surgical History:  Procedure Laterality Date  . CARPAL TUNNEL RELEASE    . GASTRIC BYPASS  08/2012    No current facility-administered medications for this encounter.    Current Outpatient Medications  Medication Sig Dispense  Refill Last Dose  . albuterol (PROVENTIL HFA) 108 (90 BASE) MCG/ACT inhaler Inhale 1 puff into the lungs.     . Budesonide-Formoterol Fumarate (SYMBICORT IN) Inhale 1 puff into the lungs as needed.   Taking  . Calcium Carbonate-Vit D-Min (CALCIUM 1200 PO) Take 1 tablet by mouth daily.   Taking  . Cholecalciferol (VITAMIN D3) 5000 UNITS CAPS Take by mouth.   Taking  . Cyanocobalamin (VITAMIN B12 PO) Take by mouth once a week.   Taking  . FLUVIRIN PRESERVATIVE FREE 0.5 ML SUSY ADM 0.5ML IM UTD  0   . Insulin Glargine (LANTUS [place]) Inject 20 Units into the skin daily.   Taking  . IRON PO Take by mouth daily.   Taking  . LANTUS 100 UNIT/ML injection      . levothyroxine (SYNTHROID, LEVOTHROID) 150 MCG tablet Take 150 mcg by mouth daily before breakfast.     . losartan (COZAAR) 50 MG tablet Take 1 tablet by mouth daily.   Taking  . metroNIDAZOLE (METROGEL) 1 % gel Apply topically.   Taking  . Multiple Vitamin (MULTIVITAMIN) tablet Take 1 tablet by mouth daily.   Taking  . nitroGLYCERIN (NITRODUR - DOSED IN MG/24 HR) 0.2 mg/hr patch Place 1/2 patch to the affected area daily 30 patch 1   . ONE TOUCH ULTRA TEST test strip      . ONETOUCH DELICA LANCETS FINE MISC      . pravastatin (PRAVACHOL) 40 MG tablet    Taking   No Known Allergies  Social History   Tobacco Use  . Smoking status: Never Smoker  . Smokeless tobacco: Never Used  Substance Use Topics  . Alcohol use: No    Family History  Problem Relation Age of Onset  . Cancer Mother        lung  . Diabetes Father   . Heart disease Father   . Hyperlipidemia Father   . Hypertension Father   . Heart attack Father   . Other Father        varicose veins  . Hyperlipidemia Brother      Review of Systems  Eyes:       Wears glasses  All other systems reviewed and are negative.   Objective:  Physical Exam  Constitutional: She is oriented to person, place, and time. She appears well-developed and well-nourished.  HENT:  Head:  Normocephalic and atraumatic.  Eyes: Pupils are equal, round, and reactive to light. Conjunctivae and EOM are normal.  Neck: Normal range of motion. Neck supple.  Cardiovascular: Normal rate, regular rhythm, normal heart sounds and intact distal pulses.  Respiratory: Effort normal and breath sounds normal.  GI: Soft. Bowel sounds are normal.  Musculoskeletal:       Right knee: She exhibits decreased range of motion (10-100) and deformity. Tenderness found. Medial joint line and lateral joint line tenderness noted.       Legs: Neurological: She is alert and oriented to person, place, and time.  Skin: Skin is warm and dry.    Vital signs in last 24 hours: BP: ()/()  Arterial Line BP: ()/()   Labs:   Estimated body mass index is 24.03 kg/m as calculated from the following:   Height as of 09/18/15:  (1.626 m).   Weight as of 09/18/15: 63.5 kg (140 lb).   Imaging Review Plain radiographs demonstrate moderate degenerative joint disease of the right knee(s). Tricompartmental bone-on-bone arthritis. The overall alignment ismild valgus. The bone quality appears to be adequate for age and reported activity level.   Preoperative templating of the joint replacement has been completed, documented, and submitted to the Operating Room personnel in order to optimize intra-operative equipment management.   Anticipated LOS equal to or greater than 2 midnights due to - Age 84 and older with one or more of the following:  - Obesity  - Expected need for hospital services (PT, OT, Nursing) required for safe  discharge  - Anticipated need for postoperative skilled nursing care or inpatient rehab  - Active co-morbidities: Diabetes   Assessment/Plan:  End stage arthritis, right knee   The patient history, physical examination, clinical judgment of the provider and imaging studies are consistent with end stage degenerative joint disease of the right knee(s) and total knee arthroplasty is  deemed medically necessary. The treatment options including medical management, injection therapy arthroscopy and arthroplasty were discussed at length. The risks and benefits of total knee arthroplasty were presented and reviewed. The risks due to aseptic loosening, infection, stiffness, patella tracking problems, thromboembolic complications and other imponderables were discussed. The patient acknowledged the explanation, agreed to proceed with the plan and consent was signed. Patient is being admitted for inpatient treatment for surgery, pain control, PT, OT, prophylactic antibiotics, VTE prophylaxis, progressive ambulation and ADL's and discharge planning. The patient is planning to be discharged home with home health services. Outpatient PT appointment made with [facility].

## 2018-03-29 NOTE — Patient Instructions (Addendum)
Rebecca Camacho  03/29/2018   Your procedure is scheduled on: 04-05-18   Report to [facility] Main  Entrance              Report to admitting at    0550  AM    Call this number if you have problems the morning of surgery [phone]   Remember: Do not eat food or drink liquids :After Midnight.     Take these medicines the morning of surgery with A SIP OF WATER:prevastatin, levothyroxine, inhalers and bring, tylenol if needed      How to Manage Your Diabetes Before and After Surgery  Why is it important to control my blood sugar before and after surgery? . Improving blood sugar levels before and after surgery helps healing and can limit problems. . A way of improving blood sugar control is eating a healthy diet by: o  Eating less sugar and carbohydrates o  Increasing activity/exercise o  Talking with your doctor about reaching your blood sugar goals . High blood sugars (greater than 180 mg/dL) can raise your risk of infections and slow your recovery, so you will need to focus on controlling your diabetes during the weeks before surgery. . Make sure that the doctor who takes care of your diabetes knows about your planned surgery including the date and location.  How do I manage my blood sugar before surgery? . Check your blood sugar at least 4 times a day, starting 2 days before surgery, to make sure that the level is not too high or low. o Check your blood sugar the morning of your surgery when you wake up and every 2 hours until you get to the Short Stay unit. . If your blood sugar is less than 70 mg/dL, you will need to treat for low blood sugar: o Do not take insulin. o Treat a low blood sugar (less than 70 mg/dL) with  cup of clear juice (cranberry or apple), 4 glucose tablets, OR glucose gel. o Recheck blood sugar in 15 minutes after treatment (to make sure it is greater than 70 mg/dL). If your blood sugar is not greater than 70 mg/dL on recheck,  call [phone] for further instructions. . Report your blood sugar to the short stay nurse when you get to Short Stay.  . If you are admitted to the hospital after surgery: o Your blood sugar will be checked by the staff and you will probably be given insulin after surgery (instead of oral diabetes medicines) to make sure you have good blood sugar levels. o The goal for blood sugar control after surgery is 80-180 mg/dL.   WHAT DO I DO ABOUT MY DIABETES MEDICATION?  Marland Kitchen Do not take oral diabetes medicines (pills) the morning of surgery.  . THE NIGHT BEFORE SURGERY, do not take any bedtime dose of humalog insulin         DAY BEFORE SURGERY TAKE 100% OF YOUR NORMAL TRESBIA  INSULIN DOSE IF YOU TAKE IN THE AM  . THE MORNING OF SURGERY, take   50%  of your normal  Tresbia insulin dose.  . The day of surgery, do not take other diabetes injectables, including Byetta (exenatide), Bydureon (exenatide ER), Victoza (liraglutide), or Trulicity (dulaglutide).  . If your CBG is greater than 220 mg/dL, you may take  of your usual  . (correction) dose of humalog insulin.   Patient Signature:  Date:   Nurse Signature:  Date:   Reviewed and Endorsed by [facility] Patient Education Committee, August 2015                               You may not have any metal on your body including hair pins and              piercings  Do not wear jewelry, make-up, lotions, powders or perfumes, deodorant             Do not wear nail polish.  Do not shave  48 hours prior to surgery.     Do not bring valuables to the hospital. [employer] IS NOT             RESPONSIBLE   FOR VALUABLES.  Contacts, dentures or bridgework may not be worn into surgery.  Leave suitcase in the car. After surgery it may be brought to your room.     Patients discharged the day of surgery will not be allowed to drive home.  Name and phone number of your driver:  Special Instructions: N/A              Please read over the following  fact sheets you were given: _____________________________________________________________________             [facility] - Preparing for [facility] Before surgery, you can play an important role.  Because skin is not sterile, your skin needs to be as free of germs as possible.  You can reduce the number of germs on your skin by washing with CHG (chlorahexidine gluconate) soap before surgery.  CHG is an antiseptic cleaner which kills germs and bonds with the skin to continue killing germs even after washing. Please DO NOT use if you have an allergy to CHG or antibacterial soaps.  If your skin becomes reddened/irritated stop using the CHG and inform your nurse when you arrive at Short Stay. Do not shave (including legs and underarms) for at least 48 hours prior to the first CHG shower.  You may shave your face/neck. Please follow these instructions carefully:  1.  Shower with CHG Soap the night before surgery and the  morning of Surgery.  2.  If you choose to wash your hair, wash your hair first as usual with your  normal  shampoo.  3.  After you shampoo, rinse your hair and body thoroughly to remove the  shampoo.                           4.  Use CHG as you would any other liquid soap.  You can apply chg directly  to the skin and wash                       Gently with a scrungie or clean washcloth.  5.  Apply the CHG Soap to your body ONLY FROM THE NECK DOWN.   Do not use on face/ open                           Wound or open sores. Avoid contact with eyes, ears mouth and genitals (private parts).                       Wash face,  Genitals (private parts) with  your normal soap.             6.  Wash thoroughly, paying special attention to the area where your surgery  will be performed.  7.  Thoroughly rinse your body with warm water from the neck down.  8.  DO NOT shower/wash with your normal soap after using and rinsing off  the CHG Soap.                9.  Pat yourself dry with a clean towel.             10.  Wear clean pajamas.            11.  Place clean sheets on your bed the night of your first shower and do not  sleep with pets. Day of Surgery : Do not apply any lotions/deodorants the morning of surgery.  Please wear clean clothes to the hospital/surgery center.  FAILURE TO FOLLOW THESE INSTRUCTIONS MAY RESULT IN THE CANCELLATION OF YOUR SURGERY PATIENT SIGNATURE_________________________________  NURSE SIGNATURE__________________________________  ________________________________________________________________________  WHAT IS A BLOOD TRANSFUSION? Blood Transfusion Information  A transfusion is the replacement of blood or some of its parts. Blood is made up of multiple cells which provide different functions.  Red blood cells carry oxygen and are used for blood loss replacement.  White blood cells fight against infection.  Platelets control bleeding.  Plasma helps clot blood.  Other blood products are available for specialized needs, such as hemophilia or other clotting disorders. BEFORE THE TRANSFUSION  Who gives blood for transfusions?   Healthy volunteers who are fully evaluated to make sure their blood is safe. This is blood bank blood. Transfusion therapy is the safest it has ever been in the practice of medicine. Before blood is taken from a donor, a complete history is taken to make sure that person has no history of diseases nor engages in risky social behavior (examples are intravenous drug use or sexual activity with multiple partners). The donor's travel history is screened to minimize risk of transmitting infections, such as malaria. The donated blood is tested for signs of infectious diseases, such as HIV and hepatitis. The blood is then tested to be sure it is compatible with you in order to minimize the chance of a transfusion reaction. If you or a relative donates blood, this is often done in anticipation of surgery and is not appropriate for emergency  situations. It takes many days to process the donated blood. RISKS AND COMPLICATIONS Although transfusion therapy is very safe and saves many lives, the main dangers of transfusion include:   Getting an infectious disease.  Developing a transfusion reaction. This is an allergic reaction to something in the blood you were given. Every precaution is taken to prevent this. The decision to have a blood transfusion has been considered carefully by your caregiver before blood is given. Blood is not given unless the benefits outweigh the risks. AFTER THE TRANSFUSION  Right after receiving a blood transfusion, you will usually feel much better and more energetic. This is especially true if your red blood cells have gotten low (anemic). The transfusion raises the level of the red blood cells which carry oxygen, and this usually causes an energy increase.  The nurse administering the transfusion will monitor you carefully for complications. HOME CARE INSTRUCTIONS  No special instructions are needed after a transfusion. You may find your energy is better. Speak with your caregiver about any limitations on activity for underlying diseases you  may have. SEEK MEDICAL CARE IF:   Your condition is not improving after your transfusion.  You develop redness or irritation at the intravenous (IV) site. SEEK IMMEDIATE MEDICAL CARE IF:  Any of the following symptoms occur over the next 12 hours:  Shaking chills.  You have a temperature by mouth above 102 F (38.9 C), not controlled by medicine.  Chest, back, or muscle pain.  People around you feel you are not acting correctly or are confused.  Shortness of breath or difficulty breathing.  Dizziness and fainting.  You get a rash or develop hives.  You have a decrease in urine output.  Your urine turns a dark color or changes to pink, red, or brown. Any of the following symptoms occur over the next 10 days:  You have a temperature by mouth above  102 F (38.9 C), not controlled by medicine.  Shortness of breath.  Weakness after normal activity.  The white part of the eye turns yellow (jaundice).  You have a decrease in the amount of urine or are urinating less often.  Your urine turns a dark color or changes to pink, red, or brown. Document Released: 10/03/2000 Document Revised: 12/29/2011 Document Reviewed: 05/22/2008 Exit[employer] Patient Information 2014 [place], [place].  _______________________________________________________________________  Incentive Spirometer  An incentive spirometer is a tool that can help keep your lungs clear and active. This tool measures how well you are filling your lungs with each breath. Taking long deep breaths may help reverse or decrease the chance of developing breathing (pulmonary) problems (especially infection) following:  A long period of time when you are unable to move or be active. BEFORE THE PROCEDURE   If the spirometer includes an indicator to show your best effort, your nurse or respiratory therapist will set it to a desired goal.  If possible, sit up straight or lean slightly forward. Try not to slouch.  Hold the incentive spirometer in an upright position. INSTRUCTIONS FOR USE  1. Sit on the edge of your bed if possible, or sit up as far as you can in bed or on a chair. 2. Hold the incentive spirometer in an upright position. 3. Breathe out normally. 4. Place the mouthpiece in your mouth and seal your lips tightly around it. 5. Breathe in slowly and as deeply as possible, raising the piston or the ball toward the top of the column. 6. Hold your breath for 3-5 seconds or for as long as possible. Allow the piston or ball to fall to the bottom of the column. 7. Remove the mouthpiece from your mouth and breathe out normally. 8. Rest for a few seconds and repeat Steps 1 through 7 at least 10 times every 1-2 hours when you are awake. Take your time and take a few normal breaths  between deep breaths. 9. The spirometer may include an indicator to show your best effort. Use the indicator as a goal to work toward during each repetition. 10. After each set of 10 deep breaths, practice coughing to be sure your lungs are clear. If you have an incision (the cut made at the time of surgery), support your incision when coughing by placing a pillow or rolled up towels firmly against it. Once you are able to get out of bed, walk around indoors and cough well. You may stop using the incentive spirometer when instructed by your caregiver.  RISKS AND COMPLICATIONS  Take your time so you do not get dizzy or light-headed.  If you are in  pain, you may need to take or ask for pain medication before doing incentive spirometry. It is harder to take a deep breath if you are having pain. AFTER USE  Rest and breathe slowly and easily.  It can be helpful to keep track of a log of your progress. Your caregiver can provide you with a simple table to help with this. If you are using the spirometer at home, follow these instructions: [place] IF:   You are having difficultly using the spirometer.  You have trouble using the spirometer as often as instructed.  Your pain medication is not giving enough relief while using the spirometer.  You develop fever of 100.5 F (38.1 C) or higher. SEEK IMMEDIATE MEDICAL CARE IF:   You cough up bloody sputum that had not been present before.  You develop fever of 102 F (38.9 C) or greater.  You develop worsening pain at or near the incision site. MAKE SURE YOU:   Understand these instructions.  Will watch your condition.  Will get help right away if you are not doing well or get worse. Document Released: 02/16/2007 Document Revised: 12/29/2011 Document Reviewed: 04/19/2007 [facility] Patient Information 2014 [place], [place].   ________________________________________________________________________

## 2018-03-30 ENCOUNTER — Encounter (HOSPITAL_COMMUNITY): Payer: Self-pay

## 2018-03-30 ENCOUNTER — Other Ambulatory Visit: Payer: Self-pay

## 2018-03-30 ENCOUNTER — Encounter (HOSPITAL_COMMUNITY)
Admission: RE | Admit: 2018-03-30 | Discharge: 2018-03-30 | Disposition: A | Discharge status: Home / Self Care | Payer: 59 | Source: Ambulatory Visit | Attending: Orthopedic Surgery | Admitting: Orthopedic Surgery

## 2018-03-30 DIAGNOSIS — Z01812 Encounter for preprocedural laboratory examination: Secondary | ICD-10-CM | POA: Insufficient documentation

## 2018-03-30 DIAGNOSIS — Z01810 Encounter for preprocedural cardiovascular examination: Secondary | ICD-10-CM | POA: Insufficient documentation

## 2018-03-30 DIAGNOSIS — R9431 Abnormal electrocardiogram [ECG] [EKG]: Secondary | ICD-10-CM | POA: Insufficient documentation

## 2018-03-30 DIAGNOSIS — M1711 Unilateral primary osteoarthritis, right knee: Secondary | ICD-10-CM | POA: Diagnosis not present

## 2018-03-30 HISTORY — DX: Unspecified osteoarthritis, unspecified site: M19.90

## 2018-03-30 HISTORY — DX: Hypothyroidism, unspecified: E03.9

## 2018-03-30 HISTORY — DX: Other specified postprocedural states: Z98.890

## 2018-03-30 HISTORY — DX: Unspecified asthma, uncomplicated: J45.909

## 2018-03-30 HISTORY — DX: Nausea with vomiting, unspecified: R11.2

## 2018-03-30 HISTORY — DX: Essential (primary) hypertension: I10

## 2018-03-30 LAB — COMPREHENSIVE METABOLIC PANEL
ALT: 33 U/L (ref 14–54)
AST: 38 U/L (ref 15–41)
Albumin: 4.3 g/dL (ref 3.5–5.0)
Alkaline Phosphatase: 112 U/L (ref 38–126)
Anion gap: 6 (ref 5–15)
BUN: 18 mg/dL (ref 6–20)
CO2: 34 mmol/L — ABNORMAL HIGH (ref 22–32)
Calcium: 9.6 mg/dL (ref 8.9–10.3)
Chloride: 102 mmol/L (ref 101–111)
Creatinine, Ser: 0.71 mg/dL (ref 0.44–1.00)
GFR calc Af Amer: >60 mL/min (ref >60)
GFR calc non Af Amer: >60 mL/min (ref >60)
Glucose, Bld: 103 mg/dL — ABNORMAL HIGH (ref 65–99)
Potassium: 4.6 mmol/L (ref 3.5–5.1)
Sodium: 142 mmol/L (ref 135–145)
Total Bilirubin: 0.7 mg/dL (ref 0.3–1.2)
Total Protein: 7 g/dL (ref 6.5–8.1)

## 2018-03-30 LAB — URINALYSIS, ROUTINE W REFLEX MICROSCOPIC
Bilirubin Urine: NEGATIVE
Glucose, UA: NEGATIVE mg/dL
Hgb urine dipstick: NEGATIVE
Ketones, ur: NEGATIVE mg/dL
Leukocytes, UA: NEGATIVE
Nitrite: NEGATIVE
Protein, ur: NEGATIVE mg/dL
Specific Gravity, Urine: 1.014 (ref 1.005–1.030)
pH: 7 (ref 5.0–8.0)

## 2018-03-30 LAB — CBC
HCT: 40 % (ref 36.0–46.0)
Hemoglobin: 13.8 g/dL (ref 12.0–15.0)
MCH: 32.7 pg (ref 26.0–34.0)
MCHC: 34.5 g/dL (ref 30.0–36.0)
MCV: 94.8 fL (ref 78.0–100.0)
Platelets: 265 10*3/uL (ref 150–400)
RBC: 4.22 MIL/uL (ref 3.87–5.11)
RDW: 12.4 % (ref 11.5–15.5)
WBC: 5 10*3/uL (ref 4.0–10.5)

## 2018-03-30 LAB — PROTIME-INR
INR: 0.95
Prothrombin Time: 12.6 seconds (ref 11.4–15.2)

## 2018-03-30 LAB — HEMOGLOBIN A1C
Hgb A1c MFr Bld: 8.2 % — ABNORMAL HIGH (ref 4.8–5.6)
Mean Plasma Glucose: 188.64 mg/dL

## 2018-03-30 LAB — GLUCOSE, CAPILLARY: Glucose-Capillary: 118 mg/dL — ABNORMAL HIGH (ref 65–99)

## 2018-03-30 LAB — SURGICAL PCR SCREEN
MRSA, PCR: NEGATIVE
Staphylococcus aureus: NEGATIVE

## 2018-03-30 LAB — APTT: aPTT: 29 seconds (ref 24–36)

## 2018-03-30 LAB — ABO/RH: ABO/RH(D): O POS

## 2018-03-30 NOTE — Pre-Procedure Instructions (Signed)
CMP results 03/30/2018 faxed to Dr. Aluisio via epic. 

## 2018-03-31 NOTE — Pre-Procedure Instructions (Signed)
Hgb A1C results 03/30/2018 faxed to Dr. Lequita HaltAluisio and Dr. Eloise HarmanPaterson via epic.

## 2018-04-04 MED ORDER — TRANEXAMIC ACID 1000 MG/10ML IV SOLN
1000.0000 mg | INTRAVENOUS | Status: AC
  Administered 2018-04-05: 1000 mg via INTRAVENOUS
  Filled 2018-04-04: qty 1100

## 2018-04-04 MED ORDER — BUPIVACAINE LIPOSOME 1.3 % IJ SUSP
20.0000 mL | INTRAMUSCULAR | Status: DC
  Filled 2018-04-04: qty 20

## 2018-04-04 NOTE — Anesthesia Preprocedure Evaluation (Signed)
Anesthesia Evaluation  Patient identified by MRN, date of birth, ID band Patient awake    Reviewed: Allergy & Precautions, H&P , NPO status , Patient's Chart, lab work & pertinent test results, reviewed documented beta blocker date and time   History of Anesthesia Complications (+) PONV and history of anesthetic complications  Airway Mallampati: II  TM Distance: >3 FB Neck ROM: full    Dental no notable dental hx.    Pulmonary asthma ,    Pulmonary exam normal breath sounds clear to auscultation       Cardiovascular Exercise Tolerance: Good hypertension,  Rhythm:regular Rate:Normal     Neuro/Psych    GI/Hepatic   Endo/Other  diabetes, Insulin DependentHypothyroidism   Renal/GU      Musculoskeletal  (+) Arthritis ,   Abdominal   Peds  Hematology   Anesthesia Other Findings   Reproductive/Obstetrics                             Anesthesia Physical Anesthesia Plan  ASA: III  Anesthesia Plan: Spinal   Post-op Pain Management:  Regional for Post-op pain   Induction:   PONV Risk Score and Plan: 3 and Treatment may vary due to age or medical condition, Ondansetron and Scopolamine patch - Pre-op  Airway Management Planned: Nasal Cannula, Natural Airway and Mask  Additional Equipment:   Intra-op Plan:   Post-operative Plan:   Informed Consent: I have reviewed the patients History and Physical, chart, labs and discussed the procedure including the risks, benefits and alternatives for the proposed anesthesia with the patient or authorized representative who has indicated his/her understanding and acceptance.   Dental Advisory Given  Plan Discussed with: CRNA, Anesthesiologist and Surgeon  Anesthesia Plan Comments: (  )        Anesthesia Quick Evaluation

## 2018-04-05 ENCOUNTER — Inpatient Hospital Stay (HOSPITAL_COMMUNITY)
Admission: RE | Admit: 2018-04-05 | Discharge: 2018-04-07 | DRG: 470 | Disposition: A | Discharge status: Home / Self Care | Payer: 59 | Source: Ambulatory Visit | Attending: Orthopedic Surgery | Admitting: Orthopedic Surgery

## 2018-04-05 ENCOUNTER — Encounter (HOSPITAL_COMMUNITY): Payer: Self-pay | Admitting: General Practice

## 2018-04-05 ENCOUNTER — Inpatient Hospital Stay (HOSPITAL_COMMUNITY): Payer: 59 | Admitting: Anesthesiology

## 2018-04-05 ENCOUNTER — Other Ambulatory Visit: Payer: Self-pay

## 2018-04-05 ENCOUNTER — Encounter (HOSPITAL_COMMUNITY)
Admission: RE | Disposition: A | Discharge status: Home / Self Care | Payer: Self-pay | Source: Ambulatory Visit | Attending: Orthopedic Surgery

## 2018-04-05 DIAGNOSIS — E119 Type 2 diabetes mellitus without complications: Secondary | ICD-10-CM | POA: Diagnosis present

## 2018-04-05 DIAGNOSIS — M1710 Unilateral primary osteoarthritis, unspecified knee: Secondary | ICD-10-CM | POA: Diagnosis present

## 2018-04-05 DIAGNOSIS — E7800 Pure hypercholesterolemia, unspecified: Secondary | ICD-10-CM | POA: Diagnosis present

## 2018-04-05 DIAGNOSIS — M1711 Unilateral primary osteoarthritis, right knee: Secondary | ICD-10-CM | POA: Diagnosis present

## 2018-04-05 DIAGNOSIS — I10 Essential (primary) hypertension: Secondary | ICD-10-CM | POA: Diagnosis present

## 2018-04-05 DIAGNOSIS — Z7951 Long term (current) use of inhaled steroids: Secondary | ICD-10-CM | POA: Diagnosis not present

## 2018-04-05 DIAGNOSIS — Z794 Long term (current) use of insulin: Secondary | ICD-10-CM

## 2018-04-05 DIAGNOSIS — J45909 Unspecified asthma, uncomplicated: Secondary | ICD-10-CM | POA: Diagnosis present

## 2018-04-05 DIAGNOSIS — E039 Hypothyroidism, unspecified: Secondary | ICD-10-CM | POA: Diagnosis present

## 2018-04-05 DIAGNOSIS — Z79899 Other long term (current) drug therapy: Secondary | ICD-10-CM | POA: Diagnosis not present

## 2018-04-05 DIAGNOSIS — Z9884 Bariatric surgery status: Secondary | ICD-10-CM

## 2018-04-05 DIAGNOSIS — M179 Osteoarthritis of knee, unspecified: Secondary | ICD-10-CM | POA: Diagnosis present

## 2018-04-05 HISTORY — PX: TOTAL KNEE ARTHROPLASTY: SHX125

## 2018-04-05 LAB — GLUCOSE, CAPILLARY
GLUCOSE-CAPILLARY: 125 mg/dL — AB (ref 65–99)
GLUCOSE-CAPILLARY: 256 mg/dL — AB (ref 65–99)
Glucose-Capillary: 88 mg/dL (ref 65–99)
Glucose-Capillary: 132 mg/dL — ABNORMAL HIGH (ref 65–99)
Glucose-Capillary: 282 mg/dL — ABNORMAL HIGH (ref 65–99)

## 2018-04-05 LAB — TYPE AND SCREEN
ABO/RH(D): O POS
Antibody Screen: NEGATIVE

## 2018-04-05 SURGERY — ARTHROPLASTY, KNEE, TOTAL
Anesthesia: Spinal | Site: Knee | Laterality: Right

## 2018-04-05 MED ORDER — DOCUSATE SODIUM 100 MG PO CAPS
100.0000 mg | ORAL_CAPSULE | Freq: Two times a day (BID) | ORAL | Status: DC
  Administered 2018-04-05 – 2018-04-07 (×4): 100 mg via ORAL
  Filled 2018-04-05 (×4): qty 1

## 2018-04-05 MED ORDER — SODIUM CHLORIDE 0.9 % IJ SOLN
INTRAMUSCULAR | Status: AC
  Filled 2018-04-05: qty 50

## 2018-04-05 MED ORDER — FLEET ENEMA 7-19 GM/118ML RE ENEM: 1.0000 | ENEMA | Freq: Once | RECTAL | Status: DC | PRN

## 2018-04-05 MED ORDER — OXYCODONE HCL 5 MG PO TABS
10.0000 mg | ORAL_TABLET | ORAL | Status: DC | PRN
  Administered 2018-04-05: 10 mg via ORAL
  Administered 2018-04-06: 15 mg via ORAL
  Administered 2018-04-06: 10 mg via ORAL
  Administered 2018-04-07 (×3): 15 mg via ORAL
  Filled 2018-04-05: qty 2
  Filled 2018-04-05: qty 3
  Filled 2018-04-05: qty 2
  Filled 2018-04-05 (×3): qty 3

## 2018-04-05 MED ORDER — ACETAMINOPHEN 160 MG/5ML PO SOLN: 325.0000 mg | ORAL | Status: DC | PRN

## 2018-04-05 MED ORDER — EPHEDRINE 5 MG/ML INJ
INTRAVENOUS | Status: AC
  Filled 2018-04-05: qty 10

## 2018-04-05 MED ORDER — PHENOL 1.4 % MT LIQD: 1.0000 | OROMUCOSAL | Status: DC | PRN

## 2018-04-05 MED ORDER — OXYCODONE HCL 5 MG/5ML PO SOLN: 5.0000 mg | Freq: Once | ORAL | Status: DC | PRN

## 2018-04-05 MED ORDER — LEVOTHYROXINE SODIUM 125 MCG PO TABS
125.0000 ug | ORAL_TABLET | Freq: Every day | ORAL | Status: DC
  Administered 2018-04-06 – 2018-04-07 (×2): 125 ug via ORAL
  Filled 2018-04-05 (×2): qty 1

## 2018-04-05 MED ORDER — PROPOFOL 10 MG/ML IV BOLUS
INTRAVENOUS | Status: DC | PRN
  Administered 2018-04-05: 20 mg via INTRAVENOUS
  Administered 2018-04-05: 30 mg via INTRAVENOUS

## 2018-04-05 MED ORDER — ACETAMINOPHEN 325 MG PO TABS: 325.0000 mg | ORAL_TABLET | ORAL | Status: DC | PRN

## 2018-04-05 MED ORDER — SODIUM CHLORIDE 0.9 % IJ SOLN
INTRAMUSCULAR | Status: AC
  Filled 2018-04-05: qty 10

## 2018-04-05 MED ORDER — DEXAMETHASONE SODIUM PHOSPHATE 10 MG/ML IJ SOLN
INTRAMUSCULAR | Status: AC
  Filled 2018-04-05: qty 1

## 2018-04-05 MED ORDER — PROPOFOL 500 MG/50ML IV EMUL
INTRAVENOUS | Status: DC | PRN
  Administered 2018-04-05: 100 ug/kg/min via INTRAVENOUS

## 2018-04-05 MED ORDER — INSULIN DEGLUDEC 100 UNIT/ML ~~LOC~~ SOPN: 21.0000 [IU] | PEN_INJECTOR | Freq: Every day | SUBCUTANEOUS | Status: DC

## 2018-04-05 MED ORDER — BUPIVACAINE IN DEXTROSE 0.75-8.25 % IT SOLN
INTRATHECAL | Status: DC | PRN
  Administered 2018-04-05: 1.8 mL via INTRATHECAL

## 2018-04-05 MED ORDER — ONDANSETRON HCL 4 MG/2ML IJ SOLN: 4.0000 mg | Freq: Four times a day (QID) | INTRAMUSCULAR | Status: DC | PRN

## 2018-04-05 MED ORDER — DEXAMETHASONE SODIUM PHOSPHATE 10 MG/ML IJ SOLN
INTRAMUSCULAR | Status: DC | PRN
  Administered 2018-04-05: 10 mg via INTRAVENOUS

## 2018-04-05 MED ORDER — INSULIN GLARGINE 100 UNIT/ML ~~LOC~~ SOLN
21.0000 [IU] | Freq: Every day | SUBCUTANEOUS | Status: DC
  Administered 2018-04-06 – 2018-04-07 (×2): 21 [IU] via SUBCUTANEOUS
  Filled 2018-04-05 (×2): qty 0.21

## 2018-04-05 MED ORDER — ONDANSETRON HCL 4 MG/2ML IJ SOLN
INTRAMUSCULAR | Status: DC | PRN
  Administered 2018-04-05: 4 mg via INTRAVENOUS

## 2018-04-05 MED ORDER — EPHEDRINE SULFATE-NACL 50-0.9 MG/10ML-% IV SOSY
PREFILLED_SYRINGE | INTRAVENOUS | Status: DC | PRN
  Administered 2018-04-05: 5 mg via INTRAVENOUS
  Administered 2018-04-05: 10 mg via INTRAVENOUS
  Administered 2018-04-05 (×2): 5 mg via INTRAVENOUS

## 2018-04-05 MED ORDER — ONDANSETRON HCL 4 MG/2ML IJ SOLN: 4.0000 mg | Freq: Once | INTRAMUSCULAR | Status: DC | PRN

## 2018-04-05 MED ORDER — FERROUS SULFATE 325 (65 FE) MG PO TABS
325.0000 mg | ORAL_TABLET | Freq: Every day | ORAL | Status: DC
  Administered 2018-04-06 – 2018-04-07 (×2): 325 mg via ORAL
  Filled 2018-04-05 (×2): qty 1

## 2018-04-05 MED ORDER — INSULIN ASPART 100 UNIT/ML ~~LOC~~ SOLN
0.0000 [IU] | Freq: Three times a day (TID) | SUBCUTANEOUS | Status: DC
  Administered 2018-04-05: 8 [IU] via SUBCUTANEOUS
  Administered 2018-04-05: 2 [IU] via SUBCUTANEOUS
  Administered 2018-04-06: 5 [IU] via SUBCUTANEOUS
  Administered 2018-04-06: 8 [IU] via SUBCUTANEOUS
  Administered 2018-04-06: 5 [IU] via SUBCUTANEOUS
  Administered 2018-04-07: 3 [IU] via SUBCUTANEOUS

## 2018-04-05 MED ORDER — MOMETASONE FURO-FORMOTEROL FUM 200-5 MCG/ACT IN AERO
2.0000 | INHALATION_SPRAY | Freq: Two times a day (BID) | RESPIRATORY_TRACT | Status: DC
Start: 2018-04-06 — End: 2018-04-07
  Administered 2018-04-06 (×2): 2 via RESPIRATORY_TRACT
  Filled 2018-04-05: qty 8.8

## 2018-04-05 MED ORDER — ALBUTEROL SULFATE (2.5 MG/3ML) 0.083% IN NEBU: 2.5000 mg | INHALATION_SOLUTION | Freq: Four times a day (QID) | RESPIRATORY_TRACT | Status: DC | PRN

## 2018-04-05 MED ORDER — ASPIRIN EC 325 MG PO TBEC
325.0000 mg | DELAYED_RELEASE_TABLET | Freq: Two times a day (BID) | ORAL | Status: DC
  Administered 2018-04-06 – 2018-04-07 (×3): 325 mg via ORAL
  Filled 2018-04-05 (×3): qty 1

## 2018-04-05 MED ORDER — FENTANYL CITRATE (PF) 100 MCG/2ML IJ SOLN
50.0000 ug | INTRAMUSCULAR | Status: DC
  Administered 2018-04-05 (×2): 50 ug via INTRAVENOUS

## 2018-04-05 MED ORDER — CEFAZOLIN SODIUM-DEXTROSE 1-4 GM/50ML-% IV SOLN
1.0000 g | Freq: Four times a day (QID) | INTRAVENOUS | Status: AC
  Administered 2018-04-05 (×2): 1 g via INTRAVENOUS
  Filled 2018-04-05 (×2): qty 50

## 2018-04-05 MED ORDER — SODIUM CHLORIDE 0.9 % IV SOLN
INTRAVENOUS | Status: DC
  Administered 2018-04-05 – 2018-04-06 (×2): via INTRAVENOUS

## 2018-04-05 MED ORDER — LIDOCAINE 2% (20 MG/ML) 5 ML SYRINGE
INTRAMUSCULAR | Status: AC
  Filled 2018-04-05: qty 5

## 2018-04-05 MED ORDER — MENTHOL 3 MG MT LOZG: 1.0000 | LOZENGE | OROMUCOSAL | Status: DC | PRN

## 2018-04-05 MED ORDER — OXYCODONE HCL 5 MG PO TABS: 5.0000 mg | ORAL_TABLET | Freq: Once | ORAL | Status: DC | PRN

## 2018-04-05 MED ORDER — ACETAMINOPHEN 10 MG/ML IV SOLN
1000.0000 mg | Freq: Four times a day (QID) | INTRAVENOUS | Status: DC
  Administered 2018-04-05: 1000 mg via INTRAVENOUS
  Filled 2018-04-05: qty 100

## 2018-04-05 MED ORDER — PHENYLEPHRINE HCL 10 MG/ML IJ SOLN
INTRAMUSCULAR | Status: AC
  Filled 2018-04-05: qty 2

## 2018-04-05 MED ORDER — ONDANSETRON HCL 4 MG PO TABS: 4.0000 mg | ORAL_TABLET | Freq: Four times a day (QID) | ORAL | Status: DC | PRN

## 2018-04-05 MED ORDER — BUPIVACAINE HCL (PF) 0.25 % IJ SOLN
INTRAMUSCULAR | Status: AC
  Filled 2018-04-05: qty 30

## 2018-04-05 MED ORDER — DEXAMETHASONE SODIUM PHOSPHATE 10 MG/ML IJ SOLN
10.0000 mg | Freq: Once | INTRAMUSCULAR | Status: AC
  Administered 2018-04-06: 10 mg via INTRAVENOUS
  Filled 2018-04-05: qty 1

## 2018-04-05 MED ORDER — BUPIVACAINE HCL (PF) 0.75 % IJ SOLN
INTRAMUSCULAR | Status: DC | PRN
  Administered 2018-04-05: 25 mL via PERINEURAL

## 2018-04-05 MED ORDER — BISACODYL 10 MG RE SUPP: 10.0000 mg | Freq: Every day | RECTAL | Status: DC | PRN

## 2018-04-05 MED ORDER — SODIUM CHLORIDE 0.9 % IR SOLN
Status: DC | PRN
  Administered 2018-04-05: 1000 mL

## 2018-04-05 MED ORDER — FENTANYL CITRATE (PF) 100 MCG/2ML IJ SOLN
INTRAMUSCULAR | Status: AC
  Administered 2018-04-05: 50 ug via INTRAVENOUS
  Filled 2018-04-05: qty 2

## 2018-04-05 MED ORDER — HYDROMORPHONE HCL 1 MG/ML IJ SOLN: 0.5000 mg | INTRAMUSCULAR | Status: DC | PRN

## 2018-04-05 MED ORDER — 0.9 % SODIUM CHLORIDE (POUR BTL) OPTIME
TOPICAL | Status: DC | PRN
  Administered 2018-04-05: 1000 mL

## 2018-04-05 MED ORDER — TRAMADOL HCL 50 MG PO TABS: 50.0000 mg | ORAL_TABLET | Freq: Four times a day (QID) | ORAL | Status: DC | PRN

## 2018-04-05 MED ORDER — LOSARTAN POTASSIUM 50 MG PO TABS
50.0000 mg | ORAL_TABLET | Freq: Every day | ORAL | Status: DC
  Administered 2018-04-06 – 2018-04-07 (×2): 50 mg via ORAL
  Filled 2018-04-05 (×2): qty 1

## 2018-04-05 MED ORDER — PRAVASTATIN SODIUM 20 MG PO TABS
40.0000 mg | ORAL_TABLET | Freq: Every day | ORAL | Status: DC
  Administered 2018-04-06 – 2018-04-07 (×2): 40 mg via ORAL
  Filled 2018-04-05 (×2): qty 2

## 2018-04-05 MED ORDER — GABAPENTIN 300 MG PO CAPS
300.0000 mg | ORAL_CAPSULE | Freq: Once | ORAL | Status: AC
  Administered 2018-04-05: 300 mg via ORAL
  Filled 2018-04-05: qty 1

## 2018-04-05 MED ORDER — MIDAZOLAM HCL 2 MG/2ML IJ SOLN
1.0000 mg | INTRAMUSCULAR | Status: DC
  Administered 2018-04-05: 2 mg via INTRAVENOUS
  Filled 2018-04-05: qty 2

## 2018-04-05 MED ORDER — PROPOFOL 10 MG/ML IV BOLUS
INTRAVENOUS | Status: AC
  Filled 2018-04-05: qty 20

## 2018-04-05 MED ORDER — METOCLOPRAMIDE HCL 5 MG PO TABS: 5.0000 mg | ORAL_TABLET | Freq: Three times a day (TID) | ORAL | Status: DC | PRN

## 2018-04-05 MED ORDER — METHOCARBAMOL 1000 MG/10ML IJ SOLN
500.0000 mg | Freq: Four times a day (QID) | INTRAVENOUS | Status: DC | PRN
  Administered 2018-04-05: 500 mg via INTRAVENOUS
  Filled 2018-04-05: qty 550

## 2018-04-05 MED ORDER — METOCLOPRAMIDE HCL 5 MG/ML IJ SOLN: 5.0000 mg | Freq: Three times a day (TID) | INTRAMUSCULAR | Status: DC | PRN

## 2018-04-05 MED ORDER — PROPOFOL 10 MG/ML IV BOLUS
INTRAVENOUS | Status: AC
  Filled 2018-04-05: qty 80

## 2018-04-05 MED ORDER — LACTATED RINGERS IV SOLN
INTRAVENOUS | Status: DC
  Administered 2018-04-05 (×3): via INTRAVENOUS

## 2018-04-05 MED ORDER — DIPHENHYDRAMINE HCL 12.5 MG/5ML PO ELIX: 12.5000 mg | ORAL_SOLUTION | ORAL | Status: DC | PRN

## 2018-04-05 MED ORDER — ACETAMINOPHEN 500 MG PO TABS
1000.0000 mg | ORAL_TABLET | Freq: Four times a day (QID) | ORAL | Status: AC
  Administered 2018-04-05 – 2018-04-06 (×4): 1000 mg via ORAL
  Filled 2018-04-05 (×4): qty 2

## 2018-04-05 MED ORDER — DEXAMETHASONE SODIUM PHOSPHATE 10 MG/ML IJ SOLN: 8.0000 mg | Freq: Once | INTRAMUSCULAR | Status: DC

## 2018-04-05 MED ORDER — POLYETHYLENE GLYCOL 3350 17 G PO PACK: 17.0000 g | PACK | Freq: Every day | ORAL | Status: DC | PRN

## 2018-04-05 MED ORDER — BUPIVACAINE LIPOSOME 1.3 % IJ SUSP
INTRAMUSCULAR | Status: DC | PRN
  Administered 2018-04-05: 20 mL

## 2018-04-05 MED ORDER — MEPERIDINE HCL 50 MG/ML IJ SOLN: 6.2500 mg | INTRAMUSCULAR | Status: DC | PRN

## 2018-04-05 MED ORDER — TRANEXAMIC ACID 1000 MG/10ML IV SOLN
1000.0000 mg | Freq: Once | INTRAVENOUS | Status: AC
  Administered 2018-04-05: 1000 mg via INTRAVENOUS
  Filled 2018-04-05: qty 1100

## 2018-04-05 MED ORDER — SODIUM CHLORIDE 0.9 % IJ SOLN
INTRAMUSCULAR | Status: DC | PRN
  Administered 2018-04-05: 60 mL

## 2018-04-05 MED ORDER — CEFAZOLIN SODIUM-DEXTROSE 2-4 GM/100ML-% IV SOLN
2.0000 g | INTRAVENOUS | Status: AC
  Administered 2018-04-05: 2 g via INTRAVENOUS
  Filled 2018-04-05: qty 100

## 2018-04-05 MED ORDER — METHOCARBAMOL 500 MG PO TABS
500.0000 mg | ORAL_TABLET | Freq: Four times a day (QID) | ORAL | Status: DC | PRN
  Administered 2018-04-05 – 2018-04-07 (×4): 500 mg via ORAL
  Filled 2018-04-05 (×4): qty 1

## 2018-04-05 MED ORDER — CHLORHEXIDINE GLUCONATE 4 % EX LIQD: 60.0000 mL | Freq: Once | CUTANEOUS | Status: DC

## 2018-04-05 MED ORDER — ONDANSETRON HCL 4 MG/2ML IJ SOLN
INTRAMUSCULAR | Status: AC
  Filled 2018-04-05: qty 2

## 2018-04-05 MED ORDER — BUPIVACAINE-EPINEPHRINE (PF) 0.25% -1:200000 IJ SOLN
INTRAMUSCULAR | Status: AC
  Filled 2018-04-05: qty 30

## 2018-04-05 MED ORDER — OXYCODONE HCL 5 MG PO TABS
5.0000 mg | ORAL_TABLET | ORAL | Status: DC | PRN
  Administered 2018-04-05: 5 mg via ORAL
  Administered 2018-04-05: 10 mg via ORAL
  Administered 2018-04-05: 5 mg via ORAL
  Administered 2018-04-06 (×4): 10 mg via ORAL
  Filled 2018-04-05 (×4): qty 2
  Filled 2018-04-05: qty 1
  Filled 2018-04-05 (×2): qty 2

## 2018-04-05 MED ORDER — LIDOCAINE HCL (CARDIAC) PF 100 MG/5ML IV SOSY
PREFILLED_SYRINGE | INTRAVENOUS | Status: DC | PRN
  Administered 2018-04-05: 60 mg via INTRAVENOUS

## 2018-04-05 MED ORDER — FENTANYL CITRATE (PF) 100 MCG/2ML IJ SOLN: 25.0000 ug | INTRAMUSCULAR | Status: DC | PRN

## 2018-04-05 MED ORDER — PHENYLEPHRINE 40 MCG/ML (10ML) SYRINGE FOR IV PUSH (FOR BLOOD PRESSURE SUPPORT)
PREFILLED_SYRINGE | INTRAVENOUS | Status: AC
  Filled 2018-04-05: qty 10

## 2018-04-05 SURGICAL SUPPLY — 52 items
BAG DECANTER FOR FLEXI CONT (MISCELLANEOUS) IMPLANT
BAG ZIPLOCK 12X15 (MISCELLANEOUS) ×3 IMPLANT
BANDAGE ACE 6X5 VEL STRL LF (GAUZE/BANDAGES/DRESSINGS) ×3 IMPLANT
BLADE SAG 18X100X1.27 (BLADE) ×3 IMPLANT
BLADE SAW SGTL 11.0X1.19X90.0M (BLADE) ×3 IMPLANT
BOWL SMART MIX CTS (DISPOSABLE) ×3 IMPLANT
CAPT KNEE TOTAL 3 ATTUNE ×3 IMPLANT
CEMENT HV SMART SET (Cement) ×6 IMPLANT
CLOSURE WOUND 1/2 X4 (GAUZE/BANDAGES/DRESSINGS) ×2
COVER SURGICAL LIGHT HANDLE (MISCELLANEOUS) ×3 IMPLANT
CUFF TOURN SGL QUICK 34 (TOURNIQUET CUFF) ×2
CUFF TRNQT CYL 34X4X40X1 (TOURNIQUET CUFF) ×1 IMPLANT
DECANTER SPIKE VIAL GLASS SM (MISCELLANEOUS) ×3 IMPLANT
DRAPE U-SHAPE 47X51 STRL (DRAPES) ×3 IMPLANT
DRSG ADAPTIC 3X8 NADH LF (GAUZE/BANDAGES/DRESSINGS) ×3 IMPLANT
DRSG PAD ABDOMINAL 8X10 ST (GAUZE/BANDAGES/DRESSINGS) IMPLANT
DURAPREP 26ML APPLICATOR (WOUND CARE) ×3 IMPLANT
ELECT REM PT RETURN 15FT ADLT (MISCELLANEOUS) ×3 IMPLANT
EVACUATOR 1/8 PVC DRAIN (DRAIN) ×3 IMPLANT
GAUZE SPONGE 4X4 12PLY STRL (GAUZE/BANDAGES/DRESSINGS) ×3 IMPLANT
GLOVE BIO SURGEON STRL SZ 6.5 (GLOVE) ×2 IMPLANT
GLOVE BIO SURGEON STRL SZ8 (GLOVE) ×6 IMPLANT
GLOVE BIO SURGEONS STRL SZ 6.5 (GLOVE) ×1
GLOVE BIOGEL PI IND STRL 6.5 (GLOVE) IMPLANT
GLOVE BIOGEL PI IND STRL 7.0 (GLOVE) ×1 IMPLANT
GLOVE BIOGEL PI IND STRL 8 (GLOVE) ×1 IMPLANT
GLOVE BIOGEL PI INDICATOR 6.5 (GLOVE)
GLOVE BIOGEL PI INDICATOR 7.0 (GLOVE) ×2
GLOVE BIOGEL PI INDICATOR 8 (GLOVE) ×2
GOWN STRL REUS W/TWL LRG LVL3 (GOWN DISPOSABLE) ×6 IMPLANT
HANDPIECE INTERPULSE COAX TIP (DISPOSABLE) ×2
HOLDER FOLEY CATH W/STRAP (MISCELLANEOUS) ×3 IMPLANT
IMMOBILIZER KNEE 20 (SOFTGOODS) ×3 IMPLANT
IMMOBILIZER KNEE 20 THIGH 36 (SOFTGOODS) IMPLANT
MANIFOLD NEPTUNE II (INSTRUMENTS) ×3 IMPLANT
PACK TOTAL KNEE CUSTOM (KITS) ×3 IMPLANT
PAD ABD 8X10 STRL (GAUZE/BANDAGES/DRESSINGS) ×3 IMPLANT
PADDING CAST COTTON 6X4 STRL (CAST SUPPLIES) ×6 IMPLANT
POSITIONER SURGICAL ARM (MISCELLANEOUS) ×3 IMPLANT
SET HNDPC FAN SPRY TIP SCT (DISPOSABLE) ×1 IMPLANT
STRIP CLOSURE SKIN 1/2X4 (GAUZE/BANDAGES/DRESSINGS) ×4 IMPLANT
SUT MNCRL AB 4-0 PS2 18 (SUTURE) ×3 IMPLANT
SUT STRATAFIX 0 PDS 27 VIOLET (SUTURE)
SUT VIC AB 2-0 CT1 27 (SUTURE) ×6
SUT VIC AB 2-0 CT1 TAPERPNT 27 (SUTURE) ×3 IMPLANT
SUTURE STRATFX 0 PDS 27 VIOLET (SUTURE) IMPLANT
SYR 50ML LL SCALE MARK (SYRINGE) IMPLANT
TRAY FOLEY CATH 14FR (SET/KITS/TRAYS/PACK) ×3 IMPLANT
TRAY FOLEY MTR SLVR 16FR STAT (SET/KITS/TRAYS/PACK) IMPLANT
WATER STERILE IRR 1000ML POUR (IV SOLUTION) ×6 IMPLANT
WRAP KNEE MAXI GEL POST OP (GAUZE/BANDAGES/DRESSINGS) ×3 IMPLANT
YANKAUER SUCT BULB TIP 10FT TU (MISCELLANEOUS) ×3 IMPLANT

## 2018-04-05 NOTE — Progress Notes (Signed)
AssistedDr. Oddono with right, ultrasound guided, adductor canal block. Side rails up, monitors on throughout procedure. See vital signs in flow sheet. Tolerated Procedure well.  

## 2018-04-05 NOTE — Op Note (Signed)
OPERATIVE REPORT-TOTAL KNEE ARTHROPLASTY   Pre-operative diagnosis- Osteoarthritis  Right knee(s)  Post-operative diagnosis- Osteoarthritis Right knee(s)  Procedure-  Right  Total Knee Arthroplasty  Surgeon- Gus RankinFrank V. Jeannemarie Sawaya, MD  Assistant- Arther AbbottKristie Edmisten, PA-C   Anesthesia-  Adductor canal block and spinal  EBL-25 mL   Drains Hemovac  Tourniquet time-  Total Tourniquet Time Documented: Thigh (Right) - 40 minutes Total: Thigh (Right) - 40 minutes     Complications- None  Condition-PACU - hemodynamically stable.   Brief Clinical Note  Rebecca Camacho is a 62 y.o. year old female with end stage OA of her right knee with progressively worsening pain and dysfunction. She has constant pain, with activity and at rest and significant functional deficits with difficulties even with ADLs. She has had extensive non-op management including analgesics, injections of cortisone and viscosupplements, and home exercise program, but remains in significant pain with significant dysfunction.Radiographs show bone on bone arthritis lateral and patellofemoral. She presents now for right Total Knee Arthroplasty.    Procedure in detail---   The patient is brought into the operating room and positioned supine on the operating table. After successful administration of  Adductor canal block and spinal,   a tourniquet is placed high on the  Right thigh(s) and the lower extremity is prepped and draped in the usual sterile fashion. Time out is performed by the operating team and then the  Right lower extremity is wrapped in Esmarch, knee flexed and the tourniquet inflated to 300 mmHg.       A midline incision is made with a ten blade through the subcutaneous tissue to the level of the extensor mechanism. A fresh blade is used to make a medial parapatellar arthrotomy. Soft tissue over the proximal medial tibia is subperiosteally elevated to the joint line with a knife and into the semimembranosus bursa  with a Cobb elevator. Soft tissue over the proximal lateral tibia is elevated with attention being paid to avoiding the patellar tendon on the tibial tubercle. The patella is everted, knee flexed 90 degrees and the ACL and PCL are removed. Findings are bone on bone alteral and patellofemoral with large global osteophytes.        The drill is used to create a starting hole in the distal femur and the canal is thoroughly irrigated with sterile saline to remove the fatty contents. The 5 degree Right  valgus alignment guide is placed into the femoral canal and the distal femoral cutting block is pinned to remove 9 mm off the distal femur. Resection is made with an oscillating saw.      The tibia is subluxed forward and the menisci are removed. The extramedullary alignment guide is placed referencing proximally at the medial aspect of the tibial tubercle and distally along the second metatarsal axis and tibial crest. The block is pinned to remove 2mm off the more deficient lateral  side. Resection is made with an oscillating saw. Size 4is the most appropriate size for the tibia and the proximal tibia is prepared with the modular drill and keel punch for that size.      The femoral sizing guide is placed and size 5 is most appropriate. Rotation is marked off the epicondylar axis and confirmed by creating a rectangular flexion gap at 90 degrees. The size 5 cutting block is pinned in this rotation and the anterior, posterior and chamfer cuts are made with the oscillating saw. The intercondylar block is then placed and that cut is made.  Trial size 4 tibial component, trial size 5 narrow posterior stabilized femur and a 6  mm posterior stabilized rotating platform insert trial is placed. Full extension is achieved with excellent varus/valgus and anterior/posterior balance throughout full range of motion. The patella is everted and thickness measured to be 21  mm. Free hand resection is taken to 12 mm, a 35 template  is placed, lug holes are drilled, trial patella is placed, and it tracks normally. Osteophytes are removed off the posterior femur with the trial in place. All trials are removed and the cut bone surfaces prepared with pulsatile lavage. Cement is mixed and once ready for implantation, the size 4 tibial implant, size  5 narrow posterior stabilized femoral component, and the size 35 patella are cemented in place and the patella is held with the clamp. The trial insert is placed and the knee held in full extension. The Exparel (20 ml mixed with 60 ml saline) is injected into the extensor mechanism, posterior capsule, medial and lateral gutters and subcutaneous tissues.  All extruded cement is removed and once the cement is hard the permanent 6 mm posterior stabilized rotating platform insert is placed into the tibial tray.      The wound is copiously irrigated with saline solution and the extensor mechanism closed over a hemovac drain with #1 V-loc suture. The tourniquet is released for a total tourniquet time of 40  minutes. Flexion against gravity is 140 degrees and the patella tracks normally. Subcutaneous tissue is closed with 2.0 vicryl and subcuticular with running 4.0 Monocryl. The incision is cleaned and dried and steri-strips and a bulky sterile dressing are applied. The limb is placed into a knee immobilizer and the patient is awakened and transported to recovery in stable condition.      Please note that a surgical assistant was a medical necessity for this procedure in order to perform it in a safe and expeditious manner. Surgical assistant was necessary to retract the ligaments and vital neurovascular structures to prevent injury to them and also necessary for proper positioning of the limb to allow for anatomic placement of the prosthesis.   Gus Rankin Georgi Navarrete, MD    04/05/2018, 9:26 AM

## 2018-04-05 NOTE — Transfer of Care (Signed)
Immediate Anesthesia Transfer of Care Note  Patient: Rebecca Camacho  Procedure(s) Performed: RIGHT TOTAL KNEE ARTHROPLASTY (Right Knee)  Patient Location: PACU  Anesthesia Type:Spinal  Level of Consciousness: awake, alert , oriented and patient cooperative  Airway & Oxygen Therapy: Patient Spontanous Breathing and Patient connected to face mask oxygen  Post-op Assessment: Report given to RN, Post -op Vital signs reviewed and stable and Patient moving all extremities  Post vital signs: Reviewed and stable  Last Vitals:  Vitals Value Taken Time  BP    Temp    Pulse 57 04/05/2018 10:05 AM  Resp 13 04/05/2018 10:05 AM  SpO2 100 % 04/05/2018 10:05 AM  Vitals shown include unvalidated device data.  Last Pain:  Vitals:   04/05/18 0635  TempSrc:   PainSc: 0-No pain         Complications: No apparent anesthesia complications

## 2018-04-05 NOTE — Anesthesia Procedure Notes (Signed)
Spinal  Patient location during procedure: OR End time: 04/05/2018 8:17 AM Staffing Resident/CRNA: Caryl Pina T, CRNA Performed: resident/CRNA  Preanesthetic Checklist Completed: patient identified, site marked, surgical consent, pre-op evaluation, timeout performed, IV checked, risks and benefits discussed and monitors and equipment checked Spinal Block Patient position: sitting Prep: DuraPrep Patient monitoring: heart rate, continuous pulse ox, blood pressure and cardiac monitor Approach: midline Location: L4-5 Injection technique: single-shot Needle Needle type: Pencan  Needle gauge: 24 G Needle length: 9 cm Assessment Sensory level: T6 Additional Notes Expiration date of kit checked and confirmed. Patient tolerated procedure well, without complications.

## 2018-04-05 NOTE — Anesthesia Procedure Notes (Signed)
Anesthesia Regional Block: Adductor canal block   Pre-Anesthetic Checklist: ,, timeout performed, Correct Patient, Correct Site, Correct Laterality, Correct Procedure, Correct Position, site marked, Risks and benefits discussed,  Surgical consent,  Pre-op evaluation,  At surgeon's request and post-op pain management  Laterality: Right  Prep: chloraprep       Needles:  Injection technique: Single-shot  Needle Type: Echogenic Stimulator Needle     Needle Length: 5cm  Needle Gauge: 22     Additional Needles:   Procedures:, nerve stimulator,,, ultrasound used (permanent image in chart),,,,  Narrative:  Start time: 04/05/2018 7:45 AM End time: 04/05/2018 7:50 AM Injection made incrementally with aspirations every 5 mL.  Performed by: Personally  Anesthesiologist: Bethena Midgetddono, Meganne Rita, MD  Additional Notes: Functioning IV was confirmed and monitors were applied.  A 50mm 22ga Arrow echogenic stimulator needle was used. Sterile prep and drape,hand hygiene and sterile gloves were used. Ultrasound guidance: relevant anatomy identified, needle position confirmed, local anesthetic spread visualized around nerve(s)., vascular puncture avoided.  Image printed for medical record. Negative aspiration and negative test dose prior to incremental administration of local anesthetic. The patient tolerated the procedure well.

## 2018-04-05 NOTE — Interval H&P Note (Signed)
History and Physical Interval Note:  04/05/2018 6:28 AM  Rebecca Camacho  has presented today for surgery, with the diagnosis of Osteoarthritis Right Knee  The various methods of treatment have been discussed with the patient and family. After consideration of risks, benefits and other options for treatment, the patient has consented to  Procedure(s): RIGHT TOTAL KNEE ARTHROPLASTY (Right) as a surgical intervention .  The patient's history has been reviewed, patient examined, no change in status, stable for surgery.  I have reviewed the patient's chart and labs.  Questions were answered to the patient's satisfaction.     Homero FellersFrank Roger Kettles

## 2018-04-05 NOTE — Evaluation (Addendum)
Physical Therapy Evaluation Patient Details Name: Rebecca MalkinVirginia P Camacho MRN: [id number] DOB: 08-19-1956 Today's Date: 04/05/2018   History of Present Illness  R TKA  Clinical Impression  Pt is s/p TKA resulting in the deficits listed below (see PT Problem List). Min assist for bed to recliner transfer. Ambulation deferred 2* lightheadedness/mild nausea. Initiated TKA HEP. Good progress expected.  Pt will benefit from skilled PT to increase their independence and safety with mobility to allow discharge to the venue listed below.      Follow Up Recommendations Outpatient PT;Follow surgeon's recommendation for DC plan and follow-up therapies(outpt PT scheduled for FRIDAY)    Equipment Recommendations  None recommended by PT    Recommendations for Other Services       Precautions / Restrictions Precautions Precautions: Knee; instructed pt in no pillow under knee Required Braces or Orthoses: Knee Immobilizer - Right Knee Immobilizer - Right: Discontinue once straight leg raise with < 10 degree lag Restrictions Weight Bearing Restrictions: No Other Position/Activity Restrictions: WBAT      Mobility  Bed Mobility Overal bed mobility: Needs Assistance Bed Mobility: Supine to Sit     Supine to sit: HOB elevated;Min assist     General bed mobility comments: min A to support RLE and pivot hips with pad  Transfers Overall transfer level: Needs assistance Equipment used: Rolling walker (2 wheeled) Transfers: Sit to/from UGI CorporationStand;Stand Pivot Transfers Sit to Stand: Min assist Stand pivot transfers: Min assist       General transfer comment: min A to rise/steady, VCs hand placement, min A to pivot, HR 50 (pt reports this is baseline)  Ambulation/Gait             General Gait Details: deferred 2* lightheadedness/nausea  Stairs            Wheelchair Mobility    Modified Rankin (Stroke Patients Only)       Balance Overall balance assessment: Modified Independent                                           Pertinent Vitals/Pain Pain Assessment: 0-10 Pain Score: 7  Pain Location: R knee Pain Descriptors / Indicators: Sore Pain Intervention(s): Limited activity within patient's tolerance;Monitored during session;Premedicated before session;Ice applied    Home Living Family/patient expects to be discharged to:: Private residence Living Arrangements: Alone Available Help at Discharge: Family;Available 24 hours/day   Home Access: Level entry     Home Layout: One level Home Equipment: Walker - 2 wheels;Shower seat;Toilet riser      Prior Function Level of Independence: Independent               Hand Dominance        Extremity/Trunk Assessment   Upper Extremity Assessment Upper Extremity Assessment: Overall WFL for tasks assessed    Lower Extremity Assessment Lower Extremity Assessment: RLE deficits/detail RLE Deficits / Details: SLR -3/5, knee AAROM 0-40* RLE Sensation: WNL    Cervical / Trunk Assessment Cervical / Trunk Assessment: Normal  Communication   Communication: No difficulties  Cognition Arousal/Alertness: Awake/alert Behavior During Therapy: WFL for tasks assessed/performed Overall Cognitive Status: Within Functional Limits for tasks assessed                                        General Comments  Exercises Total Joint Exercises Ankle Circles/Pumps: AROM;Both;10 reps;Supine Quad Sets: AROM;Right;5 reps;Supine Heel Slides: AAROM;Right;10 reps;Supine Goniometric ROM: 0-40* AAROM R knee   Assessment/Plan    PT Assessment Patient needs continued PT services  PT Problem List Decreased strength;Decreased range of motion;Decreased activity tolerance;Decreased mobility;Pain;Decreased knowledge of use of DME       PT Treatment Interventions DME instruction;Gait training;Functional mobility training;Therapeutic activities;Patient/family education    PT Goals (Current  goals can be found in the Care Plan section)  Acute Rehab PT Goals Patient Stated Goal: to ride a bike PT Goal Formulation: With patient/family Time For Goal Achievement: 04/12/18 Potential to Achieve Goals: Good    Frequency 7X/week   Barriers to discharge        Co-evaluation               AM-PAC PT "6 Clicks" Daily Activity  Outcome Measure Difficulty turning over in bed (including adjusting bedclothes, sheets and blankets)?: A Little Difficulty moving from lying on back to sitting on the side of the bed? : Unable Difficulty sitting down on and standing up from a chair with arms (e.g., wheelchair, bedside commode, etc,.)?: Unable Help needed moving to and from a bed to chair (including a wheelchair)?: A Little Help needed walking in hospital room?: A Lot Help needed climbing 3-5 steps with a railing? : Total 6 Click Score: 11    End of Session Equipment Utilized During Treatment: Gait belt;Right knee immobilizer Activity Tolerance: Patient limited by fatigue;Treatment limited secondary to medical complications (Comment)(mild lightheadedness/nausea, RN aware) Patient left: in chair;with call bell/phone within reach;with family/visitor present Nurse Communication: Mobility status PT Visit Diagnosis: Muscle weakness (generalized) (M62.81);Difficulty in walking, not elsewhere classified (R26.2);Pain Pain - Right/Left: Right Pain - part of body: Knee    Time: 8469-6295 PT Time Calculation (min) (ACUTE ONLY): 43 min   Charges:   PT Evaluation $PT Eval Low Complexity: 1 Low PT Treatments $Therapeutic Exercise: 8-22 mins $Therapeutic Activity: 8-22 mins   PT G Codes:          Tamala Ser 04/05/2018, 1:33 PM [phone]

## 2018-04-05 NOTE — Anesthesia Postprocedure Evaluation (Signed)
Anesthesia Post Note  Patient: Rebecca Camacho  Procedure(s) Performed: RIGHT TOTAL KNEE ARTHROPLASTY (Right Knee)     Patient location during evaluation: PACU Anesthesia Type: Spinal Level of consciousness: awake and alert Pain management: pain level controlled Vital Signs Assessment: post-procedure vital signs reviewed and stable Respiratory status: spontaneous breathing and respiratory function stable Cardiovascular status: blood pressure returned to baseline and stable Postop Assessment: spinal receding Anesthetic complications: no    Last Vitals:  Vitals:   04/05/18 1045 04/05/18 1106  BP: (!) 168/78 (!) 165/59  Pulse: 61 69  Resp: 14 15  Temp: (!) 36.4 C (!) 36.4 C  SpO2: 100% 100%    Last Pain:  Vitals:   04/05/18 1106  TempSrc: Oral  PainSc:                  [name]

## 2018-04-05 NOTE — Discharge Instructions (Signed)
° °Dr. Frank Aluisio °Total Joint Specialist °[facility] °[address] °[place], [place] [address] °[phone] ° °TOTAL KNEE REPLACEMENT POSTOPERATIVE DIRECTIONS ° °Knee Rehabilitation, Guidelines Following Surgery  °Results after knee surgery are often greatly improved when you follow the exercise, range of motion and muscle strengthening exercises prescribed by your doctor. Safety measures are also important to protect the knee from further injury. Any time any of these exercises cause you to have increased pain or swelling in your knee joint, decrease the amount until you are comfortable again and slowly increase them. If you have problems or questions, call your caregiver or physical therapist for advice.  ° °HOME CARE INSTRUCTIONS  °• Remove items at home which could result in a fall. This includes throw rugs or furniture in walking pathways.  °· ICE to the affected knee every three hours for 30 minutes at a time and then as needed for pain and swelling.  Continue to use ice on the knee for pain and swelling from surgery. You may notice swelling that will progress down to the foot and ankle.  This is normal after surgery.  Elevate the leg when you are not up walking on it.   °· Continue to use the breathing machine which will help keep your temperature down.  It is common for your temperature to cycle up and down following surgery, especially at night when you are not up moving around and exerting yourself.  The breathing machine keeps your lungs expanded and your temperature down. °· Do not place pillow under knee, focus on keeping the knee straight while resting ° °DIET °You may resume your previous home diet once your are discharged from the hospital. ° °DRESSING / WOUND CARE / SHOWERING °You may shower 3 days after surgery, but keep the wounds dry during showering.  You may use an occlusive plastic wrap (Press'n Seal for example), NO SOAKING/SUBMERGING IN THE BATHTUB.  If the bandage  gets wet, change with a clean dry gauze.  If the incision gets wet, pat the wound dry with a clean towel. °You may start showering once you are discharged home but do not submerge the incision under water. Just pat the incision dry and apply a dry gauze dressing on daily. °Change the surgical dressing daily and reapply a dry dressing each time. ° °ACTIVITY °Walk with your walker as instructed. °Use walker as long as suggested by your caregivers. °Avoid periods of inactivity such as sitting longer than an hour when not asleep. This helps prevent blood clots.  °You may resume a sexual relationship in one month or when given the OK by your doctor.  °You may return to work once you are cleared by your doctor.  °Do not drive a car for 6 weeks or until released by you surgeon.  °Do not drive while taking narcotics. ° °WEIGHT BEARING °Weight bearing as tolerated with assist device (walker, cane, etc) as directed, use it as long as suggested by your surgeon or therapist, typically at least 4-6 weeks. ° °POSTOPERATIVE CONSTIPATION PROTOCOL °Constipation - defined medically as fewer than three stools per week and severe constipation as less than one stool per week. ° °One of the most common issues patients have following surgery is constipation.  Even if you have a regular bowel pattern at home, your normal regimen is likely to be disrupted due to multiple reasons following surgery.  Combination of anesthesia, postoperative narcotics, change in appetite and fluid intake all can affect your bowels.    In order to avoid complications following surgery, here are some recommendations in order to help you during your recovery period. ° °Colace (docusate) - Pick up an over-the-counter form of Colace or another stool softener and take twice a day as long as you are requiring postoperative pain medications.  Take with a full glass of water daily.  If you experience loose stools or diarrhea, hold the colace until you stool forms back  up.  If your symptoms do not get better within 1 week or if they get worse, check with your doctor. ° °Dulcolax (bisacodyl) - Pick up over-the-counter and take as directed by the product packaging as needed to assist with the movement of your bowels.  Take with a full glass of water.  Use this product as needed if not relieved by Colace only.  ° °MiraLax (polyethylene glycol) - Pick up over-the-counter to have on hand.  MiraLax is a solution that will increase the amount of water in your bowels to assist with bowel movements.  Take as directed and can mix with a glass of water, juice, soda, coffee, or tea.  Take if you go more than two days without a movement. °Do not use MiraLax more than once per day. Call your doctor if you are still constipated or irregular after using this medication for 7 days in a row. ° °If you continue to have problems with postoperative constipation, please contact the office for further assistance and recommendations.  If you experience "the worst abdominal pain ever" or develop nausea or vomiting, please contact the office immediatly for further recommendations for treatment. ° °ITCHING ° If you experience itching with your medications, try taking only a single pain pill, or even half a pain pill at a time.  You can also use Benadryl over the counter for itching or also to help with sleep.  ° °TED HOSE STOCKINGS °Wear the elastic stockings on both legs for three weeks following surgery during the day but you may remove then at night for sleeping. ° °MEDICATIONS °See your medication summary on the “After Visit Summary” that the nursing staff will review with you prior to discharge.  You may have some home medications which will be placed on hold until you complete the course of blood thinner medication.  It is important for you to complete the blood thinner medication as prescribed by your surgeon.  Continue your approved medications as instructed at time of discharge. ° °PRECAUTIONS °If  you experience chest pain or shortness of breath - call 911 immediately for transfer to the hospital emergency department.  °If you develop a fever greater that 101 F, purulent drainage from wound, increased redness or drainage from wound, foul odor from the wound/dressing, or calf pain - CONTACT YOUR SURGEON.   °                                                °FOLLOW-UP APPOINTMENTS °Make sure you keep all of your appointments after your operation with your surgeon and caregivers. You should call the office at the above phone number and make an appointment for approximately two weeks after the date of your surgery or on the date instructed by your surgeon outlined in the "After Visit Summary". ° ° °RANGE OF MOTION AND STRENGTHENING EXERCISES  °Rehabilitation of the knee is important following a knee injury or   an operation. After just a few days of immobilization, the muscles of the thigh which control the knee become weakened and shrink (atrophy). Knee exercises are designed to build up the tone and strength of the thigh muscles and to improve knee motion. Often times heat used for twenty to thirty minutes before working out will loosen up your tissues and help with improving the range of motion but do not use heat for the first two weeks following surgery. These exercises can be done on a training (exercise) mat, on the floor, on a table or on a bed. Use what ever works the best and is most comfortable for you Knee exercises include:  °• Leg Lifts - While your knee is still immobilized in a splint or cast, you can do straight leg raises. Lift the leg to 60 degrees, hold for 3 sec, and slowly lower the leg. Repeat 10-20 times 2-3 times daily. Perform this exercise against resistance later as your knee gets better.  °• Quad and Hamstring Sets - Tighten up the muscle on the front of the thigh (Quad) and hold for 5-10 sec. Repeat this 10-20 times hourly. Hamstring sets are done by pushing the foot backward against an  object and holding for 5-10 sec. Repeat as with quad sets.  °· Leg Slides: Lying on your back, slowly slide your foot toward your buttocks, bending your knee up off the floor (only go as far as is comfortable). Then slowly slide your foot back down until your leg is flat on the floor again. °· Angel Wings: Lying on your back spread your legs to the side as far apart as you can without causing discomfort.  °A rehabilitation program following serious knee injuries can speed recovery and prevent re-injury in the future due to weakened muscles. Contact your doctor or a physical therapist for more information on knee rehabilitation.  ° °IF YOU ARE TRANSFERRED TO A SKILLED REHAB FACILITY °If the patient is transferred to a skilled rehab facility following release from the hospital, a list of the current medications will be sent to the facility for the patient to continue.  When discharged from the skilled rehab facility, please have the facility set up the patient's Home Health Physical Therapy prior to being released. Also, the skilled facility will be responsible for providing the patient with their medications at time of release from the facility to include their pain medication, the muscle relaxants, and their blood thinner medication. If the patient is still at the rehab facility at time of the two week follow up appointment, the skilled rehab facility will also need to assist the patient in arranging follow up appointment in our office and any transportation needs. ° °MAKE SURE YOU:  °• Understand these instructions.  °• Get help right away if you are not doing well or get worse.  ° ° °Pick up stool softner and laxative for home use following surgery while on pain medications. °Do not submerge incision under water. °Please use good hand washing techniques while changing dressing each day. °May shower starting three days after surgery. °Please use a clean towel to pat the incision dry following showers. °Continue to  use ice for pain and swelling after surgery. °Do not use any lotions or creams on the incision until instructed by your surgeon. ° °

## 2018-04-06 LAB — CBC
HCT: 34.6 % — ABNORMAL LOW (ref 36.0–46.0)
Hemoglobin: 11.8 g/dL — ABNORMAL LOW (ref 12.0–15.0)
MCH: 32.2 pg (ref 26.0–34.0)
MCHC: 34.1 g/dL (ref 30.0–36.0)
MCV: 94.5 fL (ref 78.0–100.0)
PLATELETS: 213 10*3/uL (ref 150–400)
RBC: 3.66 MIL/uL — AB (ref 3.87–5.11)
RDW: 12.4 % (ref 11.5–15.5)
WBC: 9.3 10*3/uL (ref 4.0–10.5)

## 2018-04-06 LAB — GLUCOSE, CAPILLARY
Glucose-Capillary: 220 mg/dL — ABNORMAL HIGH (ref 65–99)
Glucose-Capillary: 210 mg/dL — ABNORMAL HIGH (ref 65–99)
Glucose-Capillary: 281 mg/dL — ABNORMAL HIGH (ref 65–99)
Glucose-Capillary: 293 mg/dL — ABNORMAL HIGH (ref 65–99)

## 2018-04-06 LAB — BASIC METABOLIC PANEL
Anion gap: 8 (ref 5–15)
BUN: 16 mg/dL (ref 6–20)
CALCIUM: 9.1 mg/dL (ref 8.9–10.3)
CO2: 28 mmol/L (ref 22–32)
CREATININE: 0.71 mg/dL (ref 0.44–1.00)
Chloride: 102 mmol/L (ref 101–111)
GFR calc Af Amer: >60 mL/min (ref >60)
Glucose, Bld: 236 mg/dL — ABNORMAL HIGH (ref 65–99)
Potassium: 4.6 mmol/L (ref 3.5–5.1)
SODIUM: 138 mmol/L (ref 135–145)

## 2018-04-06 NOTE — Progress Notes (Signed)
Spoke with patient and daughter at bedside. Confirmed plan for OP PT, already arranged. Has RW and 3n1. [phone] 

## 2018-04-06 NOTE — Progress Notes (Signed)
   Subjective: 1 Day Post-Op Procedure(s) (LRB): RIGHT TOTAL KNEE ARTHROPLASTY (Right) Patient reports pain as mild.   We will start therapy today.  Plan is to go Home after hospital stay.  Objective: Vital signs in last 24 hours: Temp:  [97.6 F (36.4 C)-98.1 F (36.7 C)] 97.9 F (36.6 C) (06/18 0956) Pulse Rate:  [41-55] 41 (06/18 0956) Resp:  [12-16] 16 (06/18 0956) BP: (115-179)/(47-67) 115/47 (06/18 0956) SpO2:  [99 %-100 %] 99 % (06/18 0956)  Intake/Output from previous day:  Intake/Output Summary (Last 24 hours) at 04/06/2018 1143 Last data filed at 04/06/2018 1010 Gross per 24 hour  Intake 2471.25 ml  Output 2145 ml  Net 326.25 ml    Intake/Output this shift: Total I/O In: 612.5 [I.V.:612.5] Out: 150 [Urine:150]  Labs: Recent Labs    04/06/18 0540  HGB 11.8*   Recent Labs    04/06/18 0540  WBC 9.3  RBC 3.66*  HCT 34.6*  PLT 213   Recent Labs    04/06/18 0540  NA 138  K 4.6  CL 102  CO2 28  BUN 16  CREATININE 0.71  GLUCOSE 236*  CALCIUM 9.1   No results for input(s): LABPT, INR in the last 72 hours.  EXAM General - Patient is Alert, Appropriate and Oriented Extremity - Neurologically intact Neurovascular intact No cellulitis present Compartment soft Dressing - dressing C/D/I Motor Function - intact, moving foot and toes well on exam.  Hemovac pulled without difficulty.  Past Medical History:  Diagnosis Date  . Arthritis   . Asthma   . Cataract    Bilateral  . Complication of anesthesia   . Diabetes mellitus without complication (HCC)   . High cholesterol   . Hypertension   . Hypothyroidism   . Indigestion   . PONV (postoperative nausea and vomiting)   . Varicose vein of leg     Assessment/Plan: 1 Day Post-Op Procedure(s) (LRB): RIGHT TOTAL KNEE ARTHROPLASTY (Right) Principal Problem:   OA (osteoarthritis) of knee   Advance diet Up with therapy D/C IV fluids Plan for discharge tomorrow  DVT Prophylaxis -  Aspirin Weight-Bearing as tolerated to right leg   Ollen GrossFrank Emmanuela Ghazi 04/06/2018, 11:43 AM

## 2018-04-06 NOTE — Progress Notes (Signed)
Physical Therapy Treatment Patient Details Name: Rebecca MalkinVirginia P Camacho MRN: [id number] DOB: [birth year] Today's Date: 04/06/2018    History of Present Illness R TKA    PT Comments    Pt progressing well with mobility, she ambulated 200' with RW and performed TKA exercises with min assist. Will do stair training tomorrow morning, then expect she'll be ready to DC from PT standpoint.  Follow Up Recommendations  Outpatient PT;Follow surgeon's recommendation for DC plan and follow-up therapies(outpt PT scheduled for FRIDAY)     Equipment Recommendations  None recommended by PT    Recommendations for Other Services       Precautions / Restrictions Precautions Precautions: Knee Precaution Comments: instructed pt in no pillow under R knee Restrictions Weight Bearing Restrictions: No Other Position/Activity Restrictions: WBAT    Mobility  Bed Mobility Overal bed mobility: Needs Assistance Bed Mobility: Supine to Sit;Sit to Supine     Supine to sit: HOB elevated;Modified independent (Device/Increase time) Sit to supine: Modified independent (Device/Increase time)      Transfers Overall transfer level: Needs assistance Equipment used: Rolling walker (2 wheeled) Transfers: Sit to/from Stand Sit to Stand: Supervision         General transfer comment: VCs hand placement  Ambulation/Gait Ambulation/Gait assistance: Supervision Gait Distance (Feet): 200 Feet Assistive device: Rolling walker (2 wheeled) Gait Pattern/deviations: Step-to pattern;Decreased step length - right;Decreased step length - left Gait velocity: WFL   General Gait Details: steady, no loss of balance   Stairs             Wheelchair Mobility    Modified Rankin (Stroke Patients Only)       Balance Overall balance assessment: Modified Independent                                          Cognition Arousal/Alertness: Awake/alert Behavior During Therapy: WFL for tasks  assessed/performed Overall Cognitive Status: Within Functional Limits for tasks assessed                                        Exercises Total Joint Exercises Ankle Circles/Pumps: AROM;Both;10 reps;Supine Quad Sets: AROM;Right;Supine;10 reps Short Arc Quad: AAROM;AROM;Right;10 reps;Supine Heel Slides: AAROM;Right;10 reps;Supine Straight Leg Raises: AAROM;AROM;Right;10 reps;Supine    General Comments        Pertinent Vitals/Pain Pain Score: 4  Pain Location: R knee Pain Descriptors / Indicators: Sore Pain Intervention(s): Limited activity within patient's tolerance;Monitored during session;Premedicated before session;Ice applied    Home Living                      Prior Function            PT Goals (current goals can now be found in the care plan section) Acute Rehab PT Goals Patient Stated Goal: to ride a bike, swim PT Goal Formulation: With patient/family Time For Goal Achievement: 04/12/18 Potential to Achieve Goals: Good Progress towards PT goals: Progressing toward goals    Frequency    7X/week      PT Plan Current plan remains appropriate    Co-evaluation              AM-PAC PT "6 Clicks" Daily Activity  Outcome Measure  Difficulty turning over in bed (including adjusting bedclothes, sheets and blankets)?: None Difficulty moving  from lying on back to sitting on the side of the bed? : A Little Difficulty sitting down on and standing up from a chair with arms (e.g., wheelchair, bedside commode, etc,.)?: A Little Help needed moving to and from a bed to chair (including a wheelchair)?: A Little Help needed walking in hospital room?: A Little Help needed climbing 3-5 steps with a railing? : A Lot 6 Click Score: 18    End of Session Equipment Utilized During Treatment: Gait belt Activity Tolerance: Patient tolerated treatment well Patient left: with call bell/phone within reach;with family/visitor present;in bed Nurse  Communication: Mobility status PT Visit Diagnosis: Muscle weakness (generalized) (M62.81);Difficulty in walking, not elsewhere classified (R26.2);Pain Pain - Right/Left: Right Pain - part of body: Knee     Time: 4098-1191 PT Time Calculation (min) (ACUTE ONLY): 35 min  Charges:  $Gait Training: 8-22 mins $Therapeutic Exercise: 8-22 mins                    G Codes:          Tamala Ser 04/06/2018, 3:37 PM [phone]

## 2018-04-06 NOTE — Progress Notes (Signed)
Physical Therapy Treatment Patient Details Name: Rebecca MalkinVirginia P Camacho MRN: [id number] DOB: Apr 11, 1956 Today's Date: 04/06/2018    History of Present Illness R TKA    PT Comments    Pt progressing well with mobility, she ambulated 200' with RW, no loss of balance, and performed TKA exercises with min assist, she is independent with R SLR.   Follow Up Recommendations  Outpatient PT;Follow surgeon's recommendation for DC plan and follow-up therapies(outpt PT scheduled for FRIDAY)     Equipment Recommendations  None recommended by PT    Recommendations for Other Services       Precautions / Restrictions Precautions Precautions: Knee Precaution Comments: instructed pt in no pillow under R knee Restrictions Weight Bearing Restrictions: No Other Position/Activity Restrictions: WBAT    Mobility  Bed Mobility Overal bed mobility: Needs Assistance Bed Mobility: Supine to Sit     Supine to sit: HOB elevated;Modified independent (Device/Increase time)        Transfers Overall transfer level: Needs assistance Equipment used: Rolling walker (2 wheeled) Transfers: Sit to/from Stand Sit to Stand: Min guard         General transfer comment: VCs hand placement  Ambulation/Gait Ambulation/Gait assistance: Min guard Gait Distance (Feet): 200 Feet Assistive device: Rolling walker (2 wheeled) Gait Pattern/deviations: Step-to pattern;Decreased step length - right;Decreased step length - left Gait velocity: WFL   General Gait Details: steady, no loss of balance   Stairs             Wheelchair Mobility    Modified Rankin (Stroke Patients Only)       Balance Overall balance assessment: Modified Independent                                          Cognition Arousal/Alertness: Awake/alert Behavior During Therapy: WFL for tasks assessed/performed Overall Cognitive Status: Within Functional Limits for tasks assessed                                         Exercises Total Joint Exercises Ankle Circles/Pumps: AROM;Both;10 reps;Supine Quad Sets: AROM;Right;Supine;10 reps Short Arc Quad: AAROM;AROM;Right;10 reps;Supine Heel Slides: AAROM;Right;10 reps;Supine Hip ABduction/ADduction: AROM;AAROM;Right;10 reps;Supine Straight Leg Raises: AAROM;AROM;Right;10 reps;Supine Long Arc Quad: AAROM;Right;5 reps;Seated Knee Flexion: AAROM;Right;5 reps;Seated Goniometric ROM: 0-40* AAROM R knee    General Comments        Pertinent Vitals/Pain Pain Score: 5  Pain Location: R knee Pain Descriptors / Indicators: Sore Pain Intervention(s): Limited activity within patient's tolerance;Monitored during session;Premedicated before session;Ice applied    Home Living                      Prior Function            PT Goals (current goals can now be found in the care plan section) Acute Rehab PT Goals Patient Stated Goal: to ride a bike, swim PT Goal Formulation: With patient/family Time For Goal Achievement: 04/12/18 Potential to Achieve Goals: Good Progress towards PT goals: Progressing toward goals    Frequency    7X/week      PT Plan Current plan remains appropriate    Co-evaluation              AM-PAC PT "6 Clicks" Daily Activity  Outcome Measure  Difficulty turning over in bed (  including adjusting bedclothes, sheets and blankets)?: A Little Difficulty moving from lying on back to sitting on the side of the bed? : A Little Difficulty sitting down on and standing up from a chair with arms (e.g., wheelchair, bedside commode, etc,.)?: A Little Help needed moving to and from a bed to chair (including a wheelchair)?: A Little Help needed walking in hospital room?: A Little Help needed climbing 3-5 steps with a railing? : A Lot 6 Click Score: 17    End of Session Equipment Utilized During Treatment: Gait belt Activity Tolerance: Patient tolerated treatment well Patient left: in chair;with  call bell/phone within reach;with family/visitor present Nurse Communication: Mobility status PT Visit Diagnosis: Muscle weakness (generalized) (M62.81);Difficulty in walking, not elsewhere classified (R26.2);Pain Pain - Right/Left: Right Pain - part of body: Knee     Time: 1040-1110 PT Time Calculation (min) (ACUTE ONLY): 30 min  Charges:  $Gait Training: 8-22 mins $Therapeutic Exercise: 8-22 mins                    G Codes:          Tamala Ser 04/06/2018, 11:15 AM [phone]

## 2018-04-07 LAB — CBC
HEMATOCRIT: 34.6 % — AB (ref 36.0–46.0)
HEMOGLOBIN: 11.8 g/dL — AB (ref 12.0–15.0)
MCH: 32.6 pg (ref 26.0–34.0)
MCHC: 34.1 g/dL (ref 30.0–36.0)
MCV: 95.6 fL (ref 78.0–100.0)
Platelets: 215 10*3/uL (ref 150–400)
RBC: 3.62 MIL/uL — ABNORMAL LOW (ref 3.87–5.11)
RDW: 12.6 % (ref 11.5–15.5)
WBC: 9.3 10*3/uL (ref 4.0–10.5)

## 2018-04-07 LAB — BASIC METABOLIC PANEL
Anion gap: 6 (ref 5–15)
BUN: 21 mg/dL — ABNORMAL HIGH (ref 6–20)
CHLORIDE: 101 mmol/L (ref 101–111)
CO2: 32 mmol/L (ref 22–32)
Calcium: 9.3 mg/dL (ref 8.9–10.3)
Creatinine, Ser: 0.62 mg/dL (ref 0.44–1.00)
GFR calc non Af Amer: >60 mL/min (ref >60)
GLUCOSE: 201 mg/dL — AB (ref 65–99)
Potassium: 4.3 mmol/L (ref 3.5–5.1)
Sodium: 139 mmol/L (ref 135–145)

## 2018-04-07 LAB — GLUCOSE, CAPILLARY: Glucose-Capillary: 156 mg/dL — ABNORMAL HIGH (ref 65–99)

## 2018-04-07 MED ORDER — TRAMADOL HCL 50 MG PO TABS: 50.0000 mg | ORAL_TABLET | Freq: Four times a day (QID) | ORAL | 0 refills | Status: DC | PRN

## 2018-04-07 MED ORDER — METHOCARBAMOL 500 MG PO TABS: 500.0000 mg | ORAL_TABLET | Freq: Four times a day (QID) | ORAL | 0 refills | Status: DC | PRN

## 2018-04-07 MED ORDER — OXYCODONE HCL 5 MG PO TABS: 5.0000 mg | ORAL_TABLET | Freq: Four times a day (QID) | ORAL | 0 refills | Status: DC | PRN

## 2018-04-07 MED ORDER — ASPIRIN 325 MG PO TBEC: 325.0000 mg | DELAYED_RELEASE_TABLET | Freq: Two times a day (BID) | ORAL | 0 refills | Status: AC

## 2018-04-07 NOTE — Progress Notes (Signed)
Physical Therapy Treatment Patient Details Name: Rebecca Camacho MRN: [id number] DOB: April 14, 1956 Today's Date: 04/07/2018    History of Present Illness R TKA    PT Comments    Stair training completed, pt demonstrates understanding of HEP, she ambulated 54' with RW without loss of balance. She is ready to DC home from PT standpoint.   Follow Up Recommendations  Outpatient PT;Follow surgeon's recommendation for DC plan and follow-up therapies(outpt PT scheduled for FRIDAY)     Equipment Recommendations  None recommended by PT    Recommendations for Other Services       Precautions / Restrictions Precautions Precautions: Knee Precaution Booklet Issued: Yes (comment) Precaution Comments: instructed pt in no pillow under R knee Restrictions Weight Bearing Restrictions: No Other Position/Activity Restrictions: WBAT    Mobility  Bed Mobility               General bed mobility comments: up in recliner  Transfers Overall transfer level: Needs assistance Equipment used: Rolling walker (2 wheeled) Transfers: Sit to/from Stand Sit to Stand: Modified independent (Device/Increase time)            Ambulation/Gait Ambulation/Gait assistance: Modified independent (Device/Increase time) Gait Distance (Feet): 240 Feet Assistive device: Rolling walker (2 wheeled) Gait Pattern/deviations: Step-to pattern;Decreased step length - right;Decreased step length - left Gait velocity: WFL   General Gait Details: steady, no loss of balance, VCs heel strike and knee flexion with swing phase   Stairs Stairs: Yes Stairs assistance: Min assist Stair Management: No rails;Backwards;Forwards;With walker;Step to pattern Number of Stairs: 1 General stair comments: instructed pt in forwards and backwards technique, she preferred backward. Daughter present to assist with RW   Wheelchair Mobility    Modified Rankin (Stroke Patients Only)       Balance Overall balance  assessment: Modified Independent                                          Cognition Arousal/Alertness: Awake/alert Behavior During Therapy: WFL for tasks assessed/performed Overall Cognitive Status: Within Functional Limits for tasks assessed                                        Exercises Total Joint Exercises Ankle Circles/Pumps: AROM;Both;10 reps;Supine Quad Sets: AROM;Right;Supine;10 reps Short Arc Quad: AAROM;AROM;Right;10 reps;Supine Heel Slides: AAROM;Right;10 reps;Supine Hip ABduction/ADduction: AROM;AAROM;Right;10 reps;Supine Straight Leg Raises: AAROM;AROM;Right;10 reps;Supine Long Arc Quad: AAROM;Right;5 reps;Seated Knee Flexion: AAROM;Right;5 reps;Seated Goniometric ROM: 0-60* AAROm R knee    General Comments        Pertinent Vitals/Pain Pain Score: 3  Pain Location: R knee Pain Descriptors / Indicators: Sore Pain Intervention(s): Limited activity within patient's tolerance;Monitored during session;Ice applied;Premedicated before session    Home Living                      Prior Function            PT Goals (current goals can now be found in the care plan section) Acute Rehab PT Goals Patient Stated Goal: to ride a bike, swim PT Goal Formulation: With patient/family Time For Goal Achievement: 04/12/18 Potential to Achieve Goals: Good Progress towards PT goals: Goals met/education completed, patient discharged from PT    Frequency    7X/week      PT Plan  Current plan remains appropriate    Co-evaluation              AM-PAC PT "6 Clicks" Daily Activity  Outcome Measure  Difficulty turning over in bed (including adjusting bedclothes, sheets and blankets)?: None Difficulty moving from lying on back to sitting on the side of the bed? : None Difficulty sitting down on and standing up from a chair with arms (e.g., wheelchair, bedside commode, etc,.)?: None Help needed moving to and from a bed to  chair (including a wheelchair)?: None Help needed walking in hospital room?: None Help needed climbing 3-5 steps with a railing? : A Little 6 Click Score: 23    End of Session Equipment Utilized During Treatment: Gait belt Activity Tolerance: Patient tolerated treatment well Patient left: with call bell/phone within reach;with family/visitor present;in chair Nurse Communication: Mobility status PT Visit Diagnosis: Muscle weakness (generalized) (M62.81);Difficulty in walking, not elsewhere classified (R26.2);Pain Pain - Right/Left: Right Pain - part of body: Knee     Time: 5075-7322 PT Time Calculation (min) (ACUTE ONLY): 27 min  Charges:  $Gait Training: 8-22 mins $Therapeutic Exercise: 8-22 mins                    G Codes:          Philomena Doheny 04/07/2018, 11:45 AM [phone]

## 2018-04-07 NOTE — Progress Notes (Signed)
   Subjective: 2 Days Post-Op Procedure(s) (LRB): RIGHT TOTAL KNEE ARTHROPLASTY (Right) Patient reports pain as mild.   Patient seen in rounds for Dr. Lequita HaltAluisio. Patient is well, and has had no acute complaints or problems. Voiding without difficulty and positive flatus. Denies chest pain, SOB, calf pain. States she is ready to go home today. Plan is to go Home after hospital stay.  Objective: Vital signs in last 24 hours: Temp:  [97.9 F (36.6 C)-98.4 F (36.9 C)] 98 F (36.7 C) (06/19 0527) Pulse Rate:  [41-62] 62 (06/19 0820) Resp:  [16-18] 18 (06/19 0527) BP: (115-151)/(47-62) 138/61 (06/19 0820) SpO2:  [96 %-99 %] 99 % (06/19 0527)  Intake/Output from previous day:  Intake/Output Summary (Last 24 hours) at 04/07/2018 0837 Last data filed at 04/06/2018 1853 Gross per 24 hour  Intake 852.5 ml  Output 150 ml  Net 702.5 ml     Labs: Recent Labs    04/06/18 0540 04/07/18 0545  HGB 11.8* 11.8*   Recent Labs    04/06/18 0540 04/07/18 0545  WBC 9.3 9.3  RBC 3.66* 3.62*  HCT 34.6* 34.6*  PLT 213 215   Recent Labs    04/06/18 0540 04/07/18 0545  NA 138 139  K 4.6 4.3  CL 102 101  CO2 28 32  BUN 16 21*  CREATININE 0.71 0.62  GLUCOSE 236* 201*  CALCIUM 9.1 9.3   Exam: General - Patient is Alert and Oriented Extremity - Neurologically intact Neurovascular intact Sensation intact distally Dorsiflexion/Plantar flexion intact Dressing/Incision - clean, dry, no drainage Motor Function - intact, moving foot and toes well on exam.   Past Medical History:  Diagnosis Date  . Arthritis   . Asthma   . Cataract    Bilateral  . Complication of anesthesia   . Diabetes mellitus without complication (HCC)   . High cholesterol   . Hypertension   . Hypothyroidism   . Indigestion   . PONV (postoperative nausea and vomiting)   . Varicose vein of leg     Assessment/Plan: 2 Days Post-Op Procedure(s) (LRB): RIGHT TOTAL KNEE ARTHROPLASTY (Right) Principal  Problem:   OA (osteoarthritis) of knee  Estimated body mass index is 25.72 kg/m as calculated from the following:   Height as of this encounter: 5' 3.5" (1.613 m).   Weight as of this encounter: 66.9 kg (147 lb 8 oz). Up with therapy D/C IV fluids  DVT Prophylaxis - Aspirin Weight-bearing as tolerated  Plan for discharge today to home after therapy. [facility] Follow-up in the office in 2 weeks.   Arther AbbottKristie Edmisten, PA-C Orthopedic Surgery 04/07/2018, 8:37 AM

## 2018-04-08 NOTE — Discharge Summary (Signed)
Physician Discharge Summary   Patient ID: Rebecca Camacho MRN: [id number] DOB/AGE: 06/26/1956 62 y.o.  Admit date: 04/05/2018 Discharge date: 04/07/2018  Primary Diagnosis: Osteoarthritis right knee   Admission Diagnoses:  Past Medical History:  Diagnosis Date  . Arthritis   . Asthma   . Cataract    Bilateral  . Complication of anesthesia   . Diabetes mellitus without complication (HCC)   . High cholesterol   . Hypertension   . Hypothyroidism   . Indigestion   . PONV (postoperative nausea and vomiting)   . Varicose vein of leg    Discharge Diagnoses:   Principal Problem:   OA (osteoarthritis) of knee  Estimated body mass index is 25.72 kg/m as calculated from the following:   Height as of this encounter: 5' 3.5" (1.613 m).   Weight as of this encounter: 66.9 kg (147 lb 8 oz).  Procedure:  Procedure(s) (LRB): RIGHT TOTAL KNEE ARTHROPLASTY (Right)   Consults: None  HPI: Rebecca Camacho is a 62 y.o. year old female with end stage OA of her right knee with progressively worsening pain and dysfunction. She has constant pain, with activity and at rest and significant functional deficits with difficulties even with ADLs. She has had extensive non-op management including analgesics, injections of cortisone and viscosupplements, and home exercise program, but remains in significant pain with significant dysfunction.Radiographs show bone on bone arthritis lateral and patellofemoral. She presents now for right Total Knee Arthroplasty.  Laboratory Data: Admission on 04/05/2018, Discharged on 04/07/2018  Component Date Value Ref Range Status  . Glucose-Capillary 04/05/2018 88  65 - 99 mg/dL Final  . Glucose-Capillary 04/05/2018 132* 65 - 99 mg/dL Final  . Comment 1 04/05/2018 Document in Chart   Final  . Glucose-Capillary 04/05/2018 125* 65 - 99 mg/dL Final  . WBC 04/06/2018 9.3  4.0 - 10.5 K/uL Final  . RBC 04/06/2018 3.66* 3.87 - 5.11 MIL/uL Final  . Hemoglobin  04/06/2018 11.8* 12.0 - 15.0 g/dL Final  . HCT 04/06/2018 34.6* 36.0 - 46.0 % Final  . MCV 04/06/2018 94.5  78.0 - 100.0 fL Final  . MCH 04/06/2018 32.2  26.0 - 34.0 pg Final  . MCHC 04/06/2018 34.1  30.0 - 36.0 g/dL Final  . RDW 04/06/2018 12.4  11.5 - 15.5 % Final  . Platelets 04/06/2018 213  150 - 400 K/uL Final   Performed at [facility], [address] [place], [place] [address]  . Sodium 04/06/2018 138  135 - 145 mmol/L Final  . Potassium 04/06/2018 4.6  3.5 - 5.1 mmol/L Final  . Chloride 04/06/2018 102  101 - 111 mmol/L Final  . CO2 04/06/2018 28  22 - 32 mmol/L Final  . Glucose, Bld 04/06/2018 236* 65 - 99 mg/dL Final  . BUN 04/06/2018 16  6 - 20 mg/dL Final  . Creatinine, Ser 04/06/2018 0.71  0.44 - 1.00 mg/dL Final  . Calcium 04/06/2018 9.1  8.9 - 10.3 mg/dL Final  . GFR calc non Af Amer 04/06/2018 >60  >60 mL/min Final  . GFR calc Af Amer 04/06/2018 >60  >60 mL/min Final   Comment: (NOTE) The eGFR has been calculated using the CKD EPI equation. This calculation has not been validated in all clinical situations. eGFR's persistently <60 mL/min signify possible Chronic Kidney Disease.   . Anion gap 04/06/2018 8  5 - 15 Final   Performed at [facility], [address] [place], [place] [address]  . Glucose-Capillary 04/05/2018 282*   65 - 99 mg/dL Final  . Glucose-Capillary 04/05/2018 256* 65 - 99 mg/dL Final  . Glucose-Capillary 04/06/2018 220* 65 - 99 mg/dL Final  . Glucose-Capillary 04/06/2018 210* 65 - 99 mg/dL Final  . WBC 04/07/2018 9.3  4.0 - 10.5 K/uL Final  . RBC 04/07/2018 3.62* 3.87 - 5.11 MIL/uL Final  . Hemoglobin 04/07/2018 11.8* 12.0 - 15.0 g/dL Final  . HCT 04/07/2018 34.6* 36.0 - 46.0 % Final  . MCV 04/07/2018 95.6  78.0 - 100.0 fL Final  . MCH 04/07/2018 32.6  26.0 - 34.0 pg Final  . MCHC 04/07/2018 34.1  30.0 - 36.0 g/dL Final  . RDW 04/07/2018 12.6  11.5 - 15.5 % Final  . Platelets 04/07/2018 215  150 - 400 K/uL  Final   Performed at [facility], [address] [place], [place] [address]  . Sodium 04/07/2018 139  135 - 145 mmol/L Final  . Potassium 04/07/2018 4.3  3.5 - 5.1 mmol/L Final  . Chloride 04/07/2018 101  101 - 111 mmol/L Final  . CO2 04/07/2018 32  22 - 32 mmol/L Final  . Glucose, Bld 04/07/2018 201* 65 - 99 mg/dL Final  . BUN 04/07/2018 21* 6 - 20 mg/dL Final  . Creatinine, Ser 04/07/2018 0.62  0.44 - 1.00 mg/dL Final  . Calcium 04/07/2018 9.3  8.9 - 10.3 mg/dL Final  . GFR calc non Af Amer 04/07/2018 >60  >60 mL/min Final  . GFR calc Af Amer 04/07/2018 >60  >60 mL/min Final   Comment: (NOTE) The eGFR has been calculated using the CKD EPI equation. This calculation has not been validated in all clinical situations. eGFR's persistently <60 mL/min signify possible Chronic Kidney Disease.   . Anion gap 04/07/2018 6  5 - 15 Final   Performed at [facility], [address] [place], [place] [address]  . Glucose-Capillary 04/06/2018 281* 65 - 99 mg/dL Final  . Glucose-Capillary 04/06/2018 293* 65 - 99 mg/dL Final  . Comment 1 04/06/2018 Notify RN   Final  . Comment 2 04/06/2018 Document in Chart   Final  . Glucose-Capillary 04/07/2018 156* 65 - 99 mg/dL Final  Hospital Outpatient Visit on 03/30/2018  Component Date Value Ref Range Status  . aPTT 03/30/2018 29  24 - 36 seconds Final   Performed at [facility], [address] [place], [place] [address]  . WBC 03/30/2018 5.0  4.0 - 10.5 K/uL Final  . RBC 03/30/2018 4.22  3.87 - 5.11 MIL/uL Final  . Hemoglobin 03/30/2018 13.8  12.0 - 15.0 g/dL Final  . HCT 03/30/2018 40.0  36.0 - 46.0 % Final  . MCV 03/30/2018 94.8  78.0 - 100.0 fL Final  . MCH 03/30/2018 32.7  26.0 - 34.0 pg Final  . MCHC 03/30/2018 34.5  30.0 - 36.0 g/dL Final  . RDW 03/30/2018 12.4  11.5 - 15.5 % Final  . Platelets 03/30/2018 265  150 - 400 K/uL Final   Performed at [facility], [address] [place], [place] [address]  . Sodium 03/30/2018 142  135 - 145 mmol/L Final  . Potassium 03/30/2018 4.6  3.5 - 5.1 mmol/L Final  . Chloride 03/30/2018 102  101 - 111 mmol/L Final  . CO2 03/30/2018 34* 22 - 32 mmol/L Final  . Glucose, Bld 03/30/2018 103* 65 - 99 mg/dL Final  . BUN 03/30/2018 18  6 - 20 mg/dL Final  . Creatinine, Ser 03/30/2018 0.71  0.44 - 1.00   mg/dL Final  . Calcium 03/30/2018 9.6  8.9 - 10.3 mg/dL Final  . Total Protein 03/30/2018 7.0  6.5 - 8.1 g/dL Final  . Albumin 03/30/2018 4.3  3.5 - 5.0 g/dL Final  . AST 03/30/2018 38  15 - 41 U/L Final  . ALT 03/30/2018 33  14 - 54 U/L Final  . Alkaline Phosphatase 03/30/2018 112  38 - 126 U/L Final  . Total Bilirubin 03/30/2018 0.7  0.3 - 1.2 mg/dL Final  . GFR calc non Af Amer 03/30/2018 >60  >60 mL/min Final  . GFR calc Af Amer 03/30/2018 >60  >60 mL/min Final   Comment: (NOTE) The eGFR has been calculated using the CKD EPI equation. This calculation has not been validated in all clinical situations. eGFR's persistently <60 mL/min signify possible Chronic Kidney Disease.   . Anion gap 03/30/2018 6  5 - 15 Final   Performed at [facility], [address] [place], [place] [address]  . Prothrombin Time 03/30/2018 12.6  11.4 - 15.2 seconds Final  . INR 03/30/2018 0.95   Final   Performed at [facility], [address] [place], [place] [address]  . ABO/RH(D) 03/30/2018 O POS   Final  . Antibody Screen 03/30/2018 NEG   Final  . Sample Expiration 03/30/2018 04/08/2018   Final  . Extend sample reason 03/30/2018    Final                   Value:NO TRANSFUSIONS OR PREGNANCY IN THE PAST 3 MONTHS Performed at [facility], [address] [place], [place] [address]   . Color, Urine 03/30/2018 YELLOW  YELLOW Final  . APPearance 03/30/2018 CLEAR  CLEAR Final  . Specific Gravity, Urine 03/30/2018 1.014  1.005 - 1.030 Final  . pH 03/30/2018 7.0  5.0 - 8.0 Final  .  Glucose, UA 03/30/2018 NEGATIVE  NEGATIVE mg/dL Final  . Hgb urine dipstick 03/30/2018 NEGATIVE  NEGATIVE Final  . Bilirubin Urine 03/30/2018 NEGATIVE  NEGATIVE Final  . Ketones, ur 03/30/2018 NEGATIVE  NEGATIVE mg/dL Final  . Protein, ur 03/30/2018 NEGATIVE  NEGATIVE mg/dL Final  . Nitrite 03/30/2018 NEGATIVE  NEGATIVE Final  . Leukocytes, UA 03/30/2018 NEGATIVE  NEGATIVE Final   Performed at [facility], [address] [place], [place] [address]  . MRSA, PCR 03/30/2018 NEGATIVE  NEGATIVE Final  . Staphylococcus aureus 03/30/2018 NEGATIVE  NEGATIVE Final   Comment: (NOTE) The Xpert SA Assay (FDA approved for NASAL specimens in patients 22 years of age and older), is one component of a comprehensive surveillance program. It is not intended to diagnose infection nor to guide or monitor treatment. Performed at [facility], [address] [place], [place] [address]   . Glucose-Capillary 03/30/2018 118* 65 - 99 mg/dL Final  . Hgb A1c MFr Bld 03/30/2018 8.2* 4.8 - 5.6 % Final   Comment: (NOTE) Pre diabetes:          5.7%-6.4% Diabetes:              >6.4% Glycemic control for   <7.0% adults with diabetes   . Mean Plasma Glucose 03/30/2018 188.64  mg/dL Final   Performed at [facility], [address] [place],  [address]  . ABO/RH(D) 03/30/2018    Final                   Value:O POS [facility], [address] [place], [place] [address]      X-Rays:No results found.  EKG: Orders placed or performed during the hospital encounter of 03/30/18  . EKG 12 lead  . EKG 12 lead     Hospital Course: Oral P Berrong is a 62 y.o. who was admitted to [facility]. They were brought to the operating room on 04/05/2018 and underwent Procedure(s): RIGHT TOTAL KNEE ARTHROPLASTY.  Patient tolerated the procedure well and was later transferred to the recovery room and then to the orthopaedic floor  for postoperative care.  They were given PO and IV analgesics for pain control following their surgery.  They were given 24 hours of postoperative antibiotics of  Anti-infectives (From admission, onward)   Start     Dose/Rate Route Frequency Ordered Stop   04/05/18 1400  ceFAZolin (ANCEF) IVPB 1 g/50 mL premix     1 g 100 mL/hr over 30 Minutes Intravenous Every 6 hours 04/05/18 1121 04/05/18 2010   04/05/18 0630  ceFAZolin (ANCEF) IVPB 2g/100 mL premix     2 g 200 mL/hr over 30 Minutes Intravenous On call to O.R. 04/05/18 0626 04/05/18 0820     and started on DVT prophylaxis in the form of Aspirin.  PT and OT were ordered for total joint protocol. Discharge planning consulted to help with postop disposition and equipment needs. Patient had a good night on the evening of surgery. Started to get OOB with therapy on POD #0. Hemovac drain was pulled without difficulty and foley removed on POD #1. Continued to work with therapy into POD #2. Pt was seen in rounds and was ready to go home. Dressing was changed and the incision was clean, dry, and intact with no drainage. Pt completed one session of therapy that day and was discharged to home after in stable condition.  Diet: Diabetic diet Activity:WBAT Follow-up:in 2 weeks at the office Disposition - Home with outpatient PT at [facility]. Discharged Condition: stable   Discharge Instructions    Call MD / Call 911   Complete by:  As directed    If you experience chest pain or shortness of breath, CALL 911 and be transported to the hospital emergency room.  If you develope a fever above 101 F, pus (white drainage) or increased drainage or redness at the wound, or calf pain, call your surgeon's office.   Change dressing   Complete by:  As directed    Change the dressing daily with sterile 4 x 4 inch gauze dressing and apply TED hose.  You may clean the incision with alcohol prior to redressing.   Constipation Prevention   Complete  by:  As directed    Drink plenty of fluids.  Prune juice may be helpful.  You may use a stool softener, such as Colace (over the counter) 100 mg twice a day.  Use MiraLax (over the counter) for constipation as needed.   Diet - low sodium heart healthy   Complete by:  As directed    Discharge instructions   Complete by:  As directed    Dr. Frank Aluisio Total Joint Specialist [facility] [address] [place], [place] [address] [phone]  TOTAL KNEE REPLACEMENT POSTOPERATIVE DIRECTIONS  Knee Rehabilitation, Guidelines Following Surgery  Results after knee surgery are often greatly improved when you follow the exercise, range of motion and muscle strengthening exercises prescribed by your doctor. Safety measures are also important to protect the knee from further injury. Any time any   of these exercises cause you to have increased pain or swelling in your knee joint, decrease the amount until you are comfortable again and slowly increase them. If you have problems or questions, call your caregiver or physical therapist for advice.   HOME CARE INSTRUCTIONS  Remove items at home which could result in a fall. This includes throw rugs or furniture in walking pathways.  ICE to the affected knee every three hours for 30 minutes at a time and then as needed for pain and swelling.  Continue to use ice on the knee for pain and swelling from surgery. You may notice swelling that will progress down to the foot and ankle.  This is normal after surgery.  Elevate the leg when you are not up walking on it.   Continue to use the breathing machine which will help keep your temperature down.  It is common for your temperature to cycle up and down following surgery, especially at night when you are not up moving around and exerting yourself.  The breathing machine keeps your lungs expanded and your temperature down. Do not place pillow under knee, focus on keeping the knee straight while resting    DIET You may resume your previous home diet once your are discharged from the hospital.  DRESSING / WOUND CARE / SHOWERING You may shower 3 days after surgery, but keep the wounds dry during showering.  You may use an occlusive plastic wrap (Press'n Seal for example), NO SOAKING/SUBMERGING IN THE BATHTUB.  If the bandage gets wet, change with a clean dry gauze.  If the incision gets wet, pat the wound dry with a clean towel. You may start showering once you are discharged home but do not submerge the incision under water. Just pat the incision dry and apply a dry gauze dressing on daily. Change the surgical dressing daily and reapply a dry dressing each time.  ACTIVITY Walk with your walker as instructed. Use walker as long as suggested by your caregivers. Avoid periods of inactivity such as sitting longer than an hour when not asleep. This helps prevent blood clots.  You may resume a sexual relationship in one month or when given the OK by your doctor.  You may return to work once you are cleared by your doctor.  Do not drive a car for 6 weeks or until released by you surgeon.  Do not drive while taking narcotics.  WEIGHT BEARING Weight bearing as tolerated with assist device (walker, cane, etc) as directed, use it as long as suggested by your surgeon or therapist, typically at least 4-6 weeks.  POSTOPERATIVE CONSTIPATION PROTOCOL Constipation - defined medically as fewer than three stools per week and severe constipation as less than one stool per week.  One of the most common issues patients have following surgery is constipation.  Even if you have a regular bowel pattern at home, your normal regimen is likely to be disrupted due to multiple reasons following surgery.  Combination of anesthesia, postoperative narcotics, change in appetite and fluid intake all can affect your bowels.  In order to avoid complications following surgery, here are some recommendations in order to help you  during your recovery period.  Colace (docusate) - Pick up an over-the-counter form of Colace or another stool softener and take twice a day as long as you are requiring postoperative pain medications.  Take with a full glass of water daily.  If you experience loose stools or diarrhea, hold the colace until you stool   forms back up.  If your symptoms do not get better within 1 week or if they get worse, check with your doctor.  Dulcolax (bisacodyl) - Pick up over-the-counter and take as directed by the product packaging as needed to assist with the movement of your bowels.  Take with a full glass of water.  Use this product as needed if not relieved by Colace only.   MiraLax (polyethylene glycol) - Pick up over-the-counter to have on hand.  MiraLax is a solution that will increase the amount of water in your bowels to assist with bowel movements.  Take as directed and can mix with a glass of water, juice, soda, coffee, or tea.  Take if you go more than two days without a movement. Do not use MiraLax more than once per day. Call your doctor if you are still constipated or irregular after using this medication for 7 days in a row.  If you continue to have problems with postoperative constipation, please contact the office for further assistance and recommendations.  If you experience "the worst abdominal pain ever" or develop nausea or vomiting, please contact the office immediatly for further recommendations for treatment.  ITCHING  If you experience itching with your medications, try taking only a single pain pill, or even half a pain pill at a time.  You can also use Benadryl over the counter for itching or also to help with sleep.   TED HOSE STOCKINGS Wear the elastic stockings on both legs for three weeks following surgery during the day but you may remove then at night for sleeping.  MEDICATIONS See your medication summary on the "After Visit Summary" that the nursing staff will review with you  prior to discharge.  You may have some home medications which will be placed on hold until you complete the course of blood thinner medication.  It is important for you to complete the blood thinner medication as prescribed by your surgeon.  Continue your approved medications as instructed at time of discharge.  PRECAUTIONS If you experience chest pain or shortness of breath - call 911 immediately for transfer to the hospital emergency department.  If you develop a fever greater that 101 F, purulent drainage from wound, increased redness or drainage from wound, foul odor from the wound/dressing, or calf pain - CONTACT YOUR SURGEON.                                                   FOLLOW-UP APPOINTMENTS Make sure you keep all of your appointments after your operation with your surgeon and caregivers. You should call the office at the above phone number and make an appointment for approximately two weeks after the date of your surgery or on the date instructed by your surgeon outlined in the "After Visit Summary".   RANGE OF MOTION AND STRENGTHENING EXERCISES  Rehabilitation of the knee is important following a knee injury or an operation. After just a few days of immobilization, the muscles of the thigh which control the knee become weakened and shrink (atrophy). Knee exercises are designed to build up the tone and strength of the thigh muscles and to improve knee motion. Often times heat used for twenty to thirty minutes before working out will loosen up your tissues and help with improving the range of motion but do not use heat   for the first two weeks following surgery. These exercises can be done on a training (exercise) mat, on the floor, on a table or on a bed. Use what ever works the best and is most comfortable for you Knee exercises include:  Leg Lifts - While your knee is still immobilized in a splint or cast, you can do straight leg raises. Lift the leg to 60 degrees, hold for 3 sec, and  slowly lower the leg. Repeat 10-20 times 2-3 times daily. Perform this exercise against resistance later as your knee gets better.  Quad and Hamstring Sets - Tighten up the muscle on the front of the thigh (Quad) and hold for 5-10 sec. Repeat this 10-20 times hourly. Hamstring sets are done by pushing the foot backward against an object and holding for 5-10 sec. Repeat as with quad sets.  Leg Slides: Lying on your back, slowly slide your foot toward your buttocks, bending your knee up off the floor (only go as far as is comfortable). Then slowly slide your foot back down until your leg is flat on the floor again. Angel Wings: Lying on your back spread your legs to the side as far apart as you can without causing discomfort.  A rehabilitation program following serious knee injuries can speed recovery and prevent re-injury in the future due to weakened muscles. Contact your doctor or a physical therapist for more information on knee rehabilitation.   IF YOU ARE TRANSFERRED TO A SKILLED REHAB FACILITY If the patient is transferred to a skilled rehab facility following release from the hospital, a list of the current medications will be sent to the facility for the patient to continue.  When discharged from the skilled rehab facility, please have the facility set up the patient's [place] prior to being released. Also, the skilled facility will be responsible for providing the patient with their medications at time of release from the facility to include their pain medication, the muscle relaxants, and their blood thinner medication. If the patient is still at the rehab facility at time of the two week follow up appointment, the skilled rehab facility will also need to assist the patient in arranging follow up appointment in our office and any transportation needs.  MAKE SURE YOU:  Understand these instructions.  Get help right away if you are not doing well or get worse.    Pick up  stool softner and laxative for home use following surgery while on pain medications. Do not submerge incision under water. Please use good hand washing techniques while changing dressing each day. May shower starting three days after surgery. Please use a clean towel to pat the incision dry following showers. Continue to use ice for pain and swelling after surgery. Do not use any lotions or creams on the incision until instructed by your surgeon.   Do not put a pillow under the knee. Place it under the heel.   Complete by:  As directed    Driving restrictions   Complete by:  As directed    No driving for two weeks   TED hose   Complete by:  As directed    Use stockings (TED hose) for three weeks on both leg(s).  You may remove them at night for sleeping.   Weight bearing as tolerated   Complete by:  As directed      Allergies as of 04/07/2018   No Known Allergies     Medication List    TAKE  these medications   acetaminophen 500 MG tablet Commonly known as:  TYLENOL Take 1,000 mg by mouth 2 (two) times daily as needed for moderate pain.   aspirin 325 MG EC tablet Take 1 tablet (325 mg total) by mouth 2 (two) times daily for 19 days. Take one tablet (325 mg) Aspirin two times a day for three weeks following surgery. Then take one baby Aspirin (81 mg) once a day for three weeks. Then discontinue aspirin.   B-12 2500 MCG Tabs Take 2,500 mcg by mouth every Wednesday.   Biotin 5000 MCG Caps Take 5,000 mcg by mouth daily.   budesonide-formoterol 160-4.5 MCG/ACT inhaler Commonly known as:  SYMBICORT Inhale 1-2 puffs into the lungs daily.   CALCIUM 1200 1200-1000 MG-UNIT Chew Chew 2 tablets by mouth daily.   diphenhydrAMINE 25 MG tablet Commonly known as:  BENADRYL Take 25 mg by mouth daily as needed for allergies.   ferrous sulfate 325 (65 FE) MG EC tablet Take 325 mg by mouth daily.   insulin lispro 100 UNIT/ML injection Commonly known as:  HUMALOG Inject 2-7 Units  into the skin 3 (three) times daily as needed for high blood sugar. 100-150 = 2 units, 151-200 = 4 units, 201-250= 6 units, >250 = 7 units   levothyroxine 125 MCG tablet Commonly known as:  SYNTHROID, LEVOTHROID Take 125 mcg by mouth daily before breakfast.   losartan 50 MG tablet Commonly known as:  COZAAR Take 50 mg by mouth daily.   methocarbamol 500 MG tablet Commonly known as:  ROBAXIN Take 1 tablet (500 mg total) by mouth every 6 (six) hours as needed for muscle spasms.   metroNIDAZOLE 1 % gel Commonly known as:  METROGEL Apply 1 application topically daily.   multivitamin tablet Take 4 tablets by mouth daily.   nitroGLYCERIN 0.2 mg/hr patch Commonly known as:  NITRODUR - Dosed in mg/24 hr Place 1/2 patch to the affected area daily   ONE TOUCH ULTRA TEST test strip Generic drug:  glucose blood   ONETOUCH DELICA LANCETS FINE Misc   oxyCODONE 5 MG immediate release tablet Commonly known as:  Oxy IR/ROXICODONE Take 1-2 tablets (5-10 mg total) by mouth every 6 (six) hours as needed for moderate pain or severe pain.   pravastatin 40 MG tablet Commonly known as:  PRAVACHOL Take 40 mg by mouth daily.   PROVENTIL HFA 108 (90 Base) MCG/ACT inhaler Generic drug:  albuterol Inhale 2 puffs into the lungs every 6 (six) hours as needed for wheezing or shortness of breath.   traMADol 50 MG tablet Commonly known as:  ULTRAM Take 1-2 tablets (50-100 mg total) by mouth every 6 (six) hours as needed for moderate pain (pain not relieved with oxycodone).   TRESIBA FLEXTOUCH 100 UNIT/ML Sopn FlexTouch Pen Generic drug:  insulin degludec Inject 21 Units into the skin daily.   Vitamin D3 5000 units Caps Take 10,000 Units by mouth daily.            Discharge Care Instructions  (From admission, onward)        Start     Ordered   04/07/18 0000  Weight bearing as tolerated     04/07/18 0840   04/07/18 0000  Change dressing    Comments:  Change the dressing daily with  sterile 4 x 4 inch gauze dressing and apply TED hose.  You may clean the incision with alcohol prior to redressing.   04/07/18 0840     Follow-up Information      Aluisio, Pilar Plate, MD Follow up in 2 week(s).   Specialty:  Orthopedic Surgery Contact information: [address] [place] [address] [phone]           Signed: Theresa Duty, PA-C Orthopaedic Surgery 04/08/2018, 9:32 AM

## 2018-05-04 ENCOUNTER — Other Ambulatory Visit: Payer: Self-pay | Admitting: *Deleted

## 2018-05-04 ENCOUNTER — Telehealth: Payer: Self-pay | Admitting: *Deleted

## 2018-05-04 DIAGNOSIS — I83893 Varicose veins of bilateral lower extremities with other complications: Secondary | ICD-10-CM

## 2018-05-04 NOTE — Telephone Encounter (Signed)
Patient was seem by Dr. Edilia Boickson in 2014. She was given instructions for conservative treatment at that time. She opted to so the tx and wait til her condition worsened. She calls in today because she is worse and wants to seek treatment. She has been wearing her thigh high compression stockings (20-30) and elevating when she can. 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 She can't take Ibuprofen, but takes Tylenol if the pain is bad. I have asked for her to have a bilateral reflux study and [facility]. Follow prn.

## 2018-05-05 ENCOUNTER — Telehealth: Payer: Self-pay | Admitting: Vascular Surgery

## 2018-05-05 NOTE — Telephone Encounter (Signed)
[facility] 05[facility]   11:00 am lower ext reflux  05/10/18 @ 9:45 New VV

## 2018-05-06 ENCOUNTER — Ambulatory Visit (HOSPITAL_COMMUNITY)
Admission: RE | Admit: 2018-05-06 | Discharge: 2018-05-06 | Disposition: A | Discharge status: Home / Self Care | Payer: 59 | Source: Ambulatory Visit | Attending: Vascular Surgery | Admitting: Vascular Surgery

## 2018-05-06 DIAGNOSIS — I83893 Varicose veins of bilateral lower extremities with other complications: Secondary | ICD-10-CM | POA: Diagnosis not present

## 2018-05-10 ENCOUNTER — Other Ambulatory Visit: Payer: Self-pay

## 2018-05-10 ENCOUNTER — Ambulatory Visit: Payer: 59 | Admitting: Vascular Surgery

## 2018-05-10 ENCOUNTER — Encounter: Payer: Self-pay | Admitting: Vascular Surgery

## 2018-05-10 VITALS — BP 136/75 | HR 60 | Temp 97.2°F | Resp 16 | Ht 64.0 in | Wt 139.0 lb

## 2018-05-10 DIAGNOSIS — I83813 Varicose veins of bilateral lower extremities with pain: Secondary | ICD-10-CM | POA: Diagnosis not present

## 2018-05-10 NOTE — Progress Notes (Signed)
Patient name: Rebecca Camacho MRN: [id number] DOB: [birth year] Sex: female   REASON FOR CONSULT:    Bilateral varicose veins.  HPI:   Rebecca Camacho is a pleasant 62 y.o. female, who presents for evaluation of painful varicose veins of both lower extremities.  The patient was seen 5 years ago in our office with varicose veins of both lower extremities.  At that time, she had reflux in the right great saphenous vein and also a short segment of reflux in the left great saphenous vein.  I wrote her a prescription for thigh-high compression stockings with a gradient of 20 to 30 mmHg.  We discussed taking Tylenol for pain as she did not tolerate ibuprofen.  We also discussed the importance of intermittent leg elevation.  I felt that if her symptoms progress she could potentially be considered for laser ablation of the right great saphenous vein in the future.  She was then lost to follow-up.  The patient presents now with worsening symptoms of both lower extremities related to her varicose veins.  She experiences aching heavy throbbing pain in both lower extremities which is aggravated by standing and relieved somewhat with elevation.  She still works at [employer] and is on her feet all day.  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  She has been wearing compression stockings at work.  She takes Tylenol as needed for pain as she does not like to take ibuprofen.  She does elevate her legs also.  This does help her symptoms.  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  Past Medical History:  Diagnosis Date  . Arthritis   . Asthma   . Cataract    Bilateral  . Complication of anesthesia   . Diabetes mellitus without complication (HCC)   . High cholesterol   . Hypertension   . Hypothyroidism   . Indigestion   . PONV (postoperative nausea and vomiting)   . Varicose vein of leg     Family History    Problem Relation Age of Onset  . Cancer Mother        lung  . Diabetes Father   . Heart disease Father   . Hyperlipidemia Father   . Hypertension Father   . Heart attack Father   . Other Father        varicose veins  . Hyperlipidemia Brother     SOCIAL HISTORY: Social History   Socioeconomic History  . Marital status: Legally Separated    Spouse name: Not on file  . Number of children: Not on file  . Years of education: Not on file  . Highest education level: Not on file  Occupational History  . Not on file  Social Needs  . Financial resource strain: Not on file  . Food insecurity:    Worry: Not on file    Inability: Not on file  . Transportation needs:    Medical: Not on file    Non-medical: Not on file  Tobacco Use  . Smoking status: Never Smoker  . Smokeless tobacco: Never Used  Substance and Sexual Activity  . Alcohol use: Yes    Comment: occ  . Drug use: No  . Sexual activity: Not on file  Lifestyle  . Physical activity:    Days per week: Not on file    Minutes per session: Not on file  . Stress: Not  on file  Relationships  . Social connections:    Talks on phone: Not on file    Gets together: Not on file    Attends religious service: Not on file    Active member of club or organization: Not on file    Attends meetings of clubs or organizations: Not on file    Relationship status: Not on file  . Intimate partner violence:    Fear of current or ex partner: Not on file    Emotionally abused: Not on file    Physically abused: Not on file    Forced sexual activity: Not on file  Other Topics Concern  . Not on file  Social History Narrative  . Not on file    Allergies  Allergen Reactions  . Other     Current Outpatient Medications  Medication Sig Dispense Refill  . acetaminophen (TYLENOL) 500 MG tablet Take 1,000 mg by mouth 2 (two) times daily as needed for moderate pain.    Marland Kitchen albuterol (PROVENTIL HFA) 108 (90 BASE) MCG/ACT inhaler Inhale 2  puffs into the lungs every 6 (six) hours as needed for wheezing or shortness of breath.     . Biotin 5000 MCG CAPS Take 5,000 mcg by mouth daily.    . budesonide-formoterol (SYMBICORT) 160-4.5 MCG/ACT inhaler Inhale 1-2 puffs into the lungs daily.    . Calcium Carbonate-Vit D-Min (CALCIUM 1200) 1200-1000 MG-UNIT CHEW Chew 2 tablets by mouth daily.    . Cholecalciferol (VITAMIN D3) 5000 UNITS CAPS Take 10,000 Units by mouth daily.     . Cyanocobalamin (B-12) 2500 MCG TABS Take 2,500 mcg by mouth every Wednesday.    . diphenhydrAMINE (BENADRYL) 25 MG tablet Take 25 mg by mouth daily as needed for allergies.    . ferrous sulfate 325 (65 FE) MG EC tablet Take 325 mg by mouth daily.    . insulin degludec (TRESIBA FLEXTOUCH) 100 UNIT/ML SOPN FlexTouch Pen Inject 21 Units into the skin daily.    . insulin lispro (HUMALOG) 100 UNIT/ML injection Inject 2-7 Units into the skin 3 (three) times daily as needed for high blood sugar. 100-150 = 2 units, 151-200 = 4 units, 201-250= 6 units, >250 = 7 units    . levothyroxine (SYNTHROID, LEVOTHROID) 125 MCG tablet Take 125 mcg by mouth daily before breakfast.    . losartan (COZAAR) 50 MG tablet Take 50 mg by mouth daily.     . methocarbamol (ROBAXIN) 500 MG tablet Take 1 tablet (500 mg total) by mouth every 6 (six) hours as needed for muscle spasms. 40 tablet 0  . metroNIDAZOLE (METROGEL) 1 % gel Apply 1 application topically daily.     . Multiple Vitamin (MULTIVITAMIN) tablet Take 4 tablets by mouth daily.     . nitroGLYCERIN (NITRODUR - DOSED IN MG/24 HR) 0.2 mg/hr patch Place 1/2 patch to the affected area daily 30 patch 1  . ONE TOUCH ULTRA TEST test strip     . ONETOUCH DELICA LANCETS FINE MISC     . pravastatin (PRAVACHOL) 40 MG tablet Take 40 mg by mouth daily.     . traMADol (ULTRAM) 50 MG tablet Take 1-2 tablets (50-100 mg total) by mouth every 6 (six) hours as needed for moderate pain (pain not relieved with oxycodone). 40 tablet 0  . oxyCODONE (OXY  IR/ROXICODONE) 5 MG immediate release tablet Take 1-2 tablets (5-10 mg total) by mouth every 6 (six) hours as needed for moderate pain or severe pain. (Patient not taking: Reported  on 05/10/2018) 56 tablet 0   No current facility-administered medications for this visit.     REVIEW OF SYSTEMS:  [X]  denotes positive finding, [ ]  denotes negative finding Cardiac  Comments:  Chest pain or chest pressure:    Shortness of breath upon exertion:    Short of breath when lying flat:    Irregular heart rhythm:        Vascular    Pain in calf, thigh, or hip brought on by ambulation: x   Pain in feet at night that wakes you up from your sleep:     Blood clot in your veins:    Leg swelling:  x       Pulmonary    Oxygen at home:    Productive cough:     Wheezing:         Neurologic    Sudden weakness in arms or legs:     Sudden numbness in arms or legs:     Sudden onset of difficulty speaking or slurred speech:    Temporary loss of vision in one eye:     Problems with dizziness:         Gastrointestinal    Blood in stool:     Vomited blood:         Genitourinary    Burning when urinating:     Blood in urine:        Psychiatric    Major depression:         Hematologic    Bleeding problems:    Problems with blood clotting too easily:        Skin    Rashes or ulcers:        Constitutional    Fever or chills:     PHYSICAL EXAM:   Vitals:   05/10/18 0943  BP: 136/75  Pulse: 60  Resp: 16  Temp: (!) 97.2 F (36.2 C)  TempSrc: Oral  SpO2: 100%  Weight: 139 lb (63 kg)  Height: 5\' 4"  (1.626 m)    GENERAL: The patient is a well-nourished female, in no acute distress. The vital signs are documented above. CARDIAC: There is a regular rate and rhythm.  VASCULAR:  ARTERIAL: I do not detect carotid bruits. She has palpable femoral and pedal pulses bilaterally. VENOUS:  On the right side she has large varicose veins along the anterior medial aspect of her distal right thigh  which extend along the lateral aspect of her right knee and then onto the posterior right calf.  There are large varicose veins in all these locations under significantly elevated venous pressure.  She has hyperpigmentation on the right and also mild leg swelling.  She also has some spider veins and reticular veins on the anterior right leg. On the left side, she has large varicose veins along the medial anterior aspect of the distal left thigh which extend along the lateral aspect of the left knee and then onto the posterior calf.  She also has hyperpigmentation on the left and some spider veins and reticular veins on the anterior left leg. I did interrogate with the Sono[employer] both lower extremities today.  The great saphenous veins appear to be accessible bilaterally. PULMONARY: There is good air exchange bilaterally without wheezing or rales. ABDOMEN: Soft and non-tender with normal pitched bowel sounds.  MUSCULOSKELETAL: There are no major deformities or cyanosis. NEUROLOGIC: No focal weakness or paresthesias are detected. SKIN: There are no ulcers or rashes noted. PSYCHIATRIC: The patient has  a normal affect.  DATA:    VENOUS DUPLEX: I have reviewed the venous duplex scan that was done on 05/06/2018.  On the right side there was no evidence of DVT or superficial thrombophlebitis.  There is deep venous reflux involving the common femoral vein.  There was also reflux at the saphenofemoral junction.  There was reflux in the superficial system involving the great saphenous vein in the proximal thigh and mid thigh.  On the left side there is no evidence of deep venous thrombosis or superficial thrombophlebitis.  There was no significant reflux in the deep system.  There was reflux at the saphenofemoral junction.  There is reflux in the superficial system involving the great saphenous vein in the proximal thigh and mid thigh.  The saphenous vein on the left was significantly dilated in the mid thigh and  proximal thigh.  MEDICAL ISSUES:   CHRONIC VENOUS INSUFFICIENCY: This patient has progressive varicose veins of both lower extremities.  She has clinical class IVa disease.CEAP on the left side great saphenous vein is significantly dilated and I think she could potentially benefit from endovenous laser ablation of the left great saphenous vein with greater than 20 stab phlebectomies.  I have written her prescription for thigh-high compression stockings with a gradient of 20-30.  She has been wearing this previously when I saw her back 5 years ago.  On the right side the great saphenous vein is not as dilated although I think this could be accessed that she could potentially be a candidate for endovenous laser ablation of the right great saphenous vein and greater than 20 stab phlebectomies in a staged fashion.  I will discuss scheduling with Marisue IvanLiz and hopefully will be able to help this nice lady with her  quite bothersome venous symptoms.  I have discussed the indications for endovenous laser ablation of the GSV, that is to lower the pressure in the veins and potentially help relieve the symptoms from venous hypertension. I have also discussed alternative options including conservative treatment with leg elevation, compression therapy, and other lifestyle changes. I have discussed the potential complications of the procedure, including, but not limited to: bleeding, bruising, leg swelling, nerve injury, skin burns, significant pain from phlebitis, deep venous thrombosis, or failure of the vein to close. All of the patient's questions were encouraged and answered.  Waverly Ferrarihristopher Jaretzy Lhommedieu [facility] [phone]

## 2018-06-22 DIAGNOSIS — Z96651 Presence of right artificial knee joint: Secondary | ICD-10-CM | POA: Insufficient documentation

## 2018-06-23 NOTE — Progress Notes (Signed)
Please place orders in Epic as patient is being scheduled for a pre-op appointment! Thank you! 

## 2018-06-28 NOTE — Patient Instructions (Addendum)
Rebecca Camacho  DOB  12/28/1955    Your procedure is scheduled on:  07-05-18     Report to [facility] Main  Entrance,  Report to Admitting at   1:40 PM    Call this number if you have problems the morning of surgery [phone]     Remember: Do not eat food or drink liquids :After Midnight. You may have a Clear Liquid Diet from midnight until 10:10 AM day of surgery.    After 10:10 AM, nothing by mouth including candy,gum,mint, with exception sips of water                                            with medication                 Take the medication AM day of surgery with sip of water:  SYMBICORT INHALER,  LEVOTHRYOXINE,  PRAVASTATIN  CLEAR LIQUID DIET   Foods Allowed                                                                     Foods Excluded  Coffee and tea, regular and decaf                             liquids that you cannot  Plain Jell-O in any flavor                                             see through such as: Fruit ices (not with fruit pulp)                                     milk, soups, orange juice  Iced Popsicles                                    All solid food Carbonated beverages, regular and diet                                    Cranberry, grape and apple juices Sports drinks like Gatorade Lightly seasoned clear broth or consume(fat free) Sugar, honey syrup  Sample Menu Breakfast                                Lunch                                     Supper Cranberry juice  Beef broth                            Chicken broth Jell-O                                     Grape juice                           Apple juice Coffee or tea                        Jell-O                                      Popsicle                                                Coffee or tea                        Coffee or tea  _____________________________________________________________________                                          [name] may not have any metal on your body including hair pins and              piercings                 Do not wear jewelry, make-up, lotions, powders or perfumes, deodorant                          Do not wear nail polish.  Do not shave  48 hours prior to surgery.                Do not bring valuables to the hospital. [name] IS NOT             RESPONSIBLE   FOR VALUABLES.  Contacts, dentures or bridgework may not be worn into surgery.       Patients discharged the day of surgery will not be allowed to drive home.  Name and phone number of your driver:  Special Instructions: N/A              Please read over the following fact sheets you were given: _____________________________________________________________________            How to Manage Your Diabetes Before and After Surgery  Why is it important to control my blood sugar before and after surgery? . Improving blood sugar levels before and after surgery helps healing and can limit problems. . A way of improving blood sugar control is eating a healthy diet by: o  Eating less sugar and carbohydrates o  Increasing activity/exercise o  Talking with your doctor about reaching your blood sugar goals . High blood sugars (greater than 180 mg/dL) can raise your risk of infections and slow your recovery, so you will need to focus on controlling your diabetes during the  weeks before surgery. . Make sure that the doctor who takes care of your diabetes knows about your planned surgery including the date and location.  How do I manage my blood sugar before surgery? . Check your blood sugar at least 4 times a day, starting 2 days before surgery, to make sure that the level is not too high or low. o Check your blood sugar the morning of your surgery when you wake up and every 2 hours until you get to the Short Stay unit. . If your blood sugar is less than 70 mg/dL, you will need to treat for low blood sugar: o Do not take  insulin. o Treat a low blood sugar (less than 70 mg/dL) with  cup of clear juice (cranberry or apple), 4 glucose tablets, OR glucose gel. o Recheck blood sugar in 15 minutes after treatment (to make sure it is greater than 70 mg/dL). If your blood sugar is not greater than 70 mg/dL on recheck, call [phone] for further instructions. . Report your blood sugar to the short stay nurse when you get to Short Stay.  . If you are admitted to the hospital after surgery: o Your blood sugar will be checked by the staff and you will probably be given insulin after surgery (instead of oral diabetes medicines) to make sure you have good blood sugar levels. o The goal for blood sugar control after surgery is 80-180 mg/dL.   WHAT DO I DO ABOUT MY DIABETES MEDICATION?  Marland Kitchen Do not take oral diabetes medicines (pills) the morning of surgery.  . THE DAY BEFORE SURGERY, take your usual dose of 21 units of Tresiba insulin, and your 2-7 units of Humalog TID      . The day of surgery, do not take other diabetes injectables, with exception do Humalog insulin per sliding scale if needed  . If your CBG is greater than 220 mg/dL, you may take  of your sliding scale  . (correction) dose of insulin.                                                                                                           [facility] - Preparing for Surgery Before surgery, you can play an important role.  Because skin is not sterile, your skin needs to be as free of germs as possible.  You can reduce the number of germs on your skin by washing with CHG (chlorahexidine gluconate) soap before surgery.  CHG is an antiseptic cleaner which kills germs and bonds with the skin to continue killing germs even after washing. Please DO NOT use if you have an allergy to CHG or antibacterial soaps.  If your skin becomes reddened/irritated stop using the CHG and inform your nurse when you arrive at Short Stay. Do not shave (including legs and  underarms) for at least 48 hours prior to the first CHG shower.  You may shave your face/neck. Please follow these instructions carefully:  1.  Shower with CHG Soap the night before surgery and the  morning of  Surgery.  2.  If you choose to wash your hair, wash your hair first as usual with your  normal  shampoo.  3.  After you shampoo, rinse your hair and body thoroughly to remove the  shampoo.                            4.  Use CHG as you would any other liquid soap.  You can apply chg directly  to the skin and wash                       Gently with a scrungie or clean washcloth.  5.  Apply the CHG Soap to your body ONLY FROM THE NECK DOWN.   Do not use on face/ open                           Wound or open sores. Avoid contact with eyes, ears mouth and genitals (private parts).                       Wash face,  Genitals (private parts) with your normal soap.             6.  Wash thoroughly, paying special attention to the area where your surgery  will be performed.  7.  Thoroughly rinse your body with warm water from the neck down.  8.  DO NOT shower/wash with your normal soap after using and rinsing off  the CHG Soap.             9.  Pat yourself dry with a clean towel.            10.  Wear clean pajamas.            11.  Place clean sheets on your bed the night of your first shower and do not  sleep with pets. Day of Surgery : Do not apply any lotions/deodorants the morning of surgery.  Please wear clean clothes to the hospital/surgery center.  FAILURE TO FOLLOW THESE INSTRUCTIONS MAY RESULT IN THE CANCELLATION OF YOUR SURGERY PATIENT SIGNATURE_________________________________  NURSE SIGNATURE________________________________    ____________________________________________________                                                                                                                                                          Rogelia Mire  An incentive spirometer is a  tool that can help keep your lungs clear and active. This tool measures how well you are filling your lungs with each breath. Taking long deep breaths may help reverse or decrease the chance of developing breathing (pulmonary) problems (especially infection) following:  A long period of time  when you are unable to move or be active. BEFORE THE PROCEDURE   If the spirometer includes an indicator to show your best effort, your nurse or respiratory therapist will set it to a desired goal.  If possible, sit up straight or lean slightly forward. Try not to slouch.  Hold the incentive spirometer in an upright position. INSTRUCTIONS FOR USE  1. Sit on the edge of your bed if possible, or sit up as far as you can in bed or on a chair. 2. Hold the incentive spirometer in an upright position. 3. Breathe out normally. 4. Place the mouthpiece in your mouth and seal your lips tightly around it. 5. Breathe in slowly and as deeply as possible, raising the piston or the ball toward the top of the column. 6. Hold your breath for 3-5 seconds or for as long as possible. Allow the piston or ball to fall to the bottom of the column. 7. Remove the mouthpiece from your mouth and breathe out normally. 8. Rest for a few seconds and repeat Steps 1 through 7 at least 10 times every 1-2 hours when you are awake. Take your time and take a few normal breaths between deep breaths. 9. The spirometer may include an indicator to show your best effort. Use the indicator as a goal to work toward during each repetition. 10. After each set of 10 deep breaths, practice coughing to be sure your lungs are clear. If you have an incision (the cut made at the time of surgery), support your incision when coughing by placing a pillow or rolled up towels firmly against it. Once you are able to get out of bed, walk around indoors and cough well. You may stop using the incentive spirometer when instructed by your caregiver.  RISKS AND  COMPLICATIONS  Take your time so you do not get dizzy or light-headed.  If you are in pain, you may need to take or ask for pain medication before doing incentive spirometry. It is harder to take a deep breath if you are having pain. AFTER USE  Rest and breathe slowly and easily.  It can be helpful to keep track of a log of your progress. Your caregiver can provide you with a simple table to help with this. If you are using the spirometer at home, follow these instructions: SEEK MEDICAL CARE IF:   You are having difficultly using the spirometer.  You have trouble using the spirometer as often as instructed.  Your pain medication is not giving enough relief while using the spirometer.  You develop fever of 100.5 F (38.1 C) or higher. SEEK IMMEDIATE MEDICAL CARE IF:   You cough up bloody sputum that had not been present before.  You develop fever of 102 F (38.9 C) or greater.  You develop worsening pain at or near the incision site. MAKE SURE YOU:   Understand these instructions.  Will watch your condition.  Will get help right away if you are not doing well or get worse. Document Released: 02/16/2007 Document Revised: 12/29/2011 Document Reviewed: 04/19/2007 [facility] Patient Information 2014 [place], [place].   ________________________________________________________________________

## 2018-06-28 NOTE — Progress Notes (Signed)
03-30-18 (Epic) EKG

## 2018-06-29 ENCOUNTER — Encounter (HOSPITAL_COMMUNITY)
Admission: RE | Admit: 2018-06-29 | Discharge: 2018-06-29 | Disposition: A | Discharge status: Home / Self Care | Payer: 59 | Source: Ambulatory Visit | Attending: Orthopedic Surgery | Admitting: Orthopedic Surgery

## 2018-06-29 ENCOUNTER — Encounter (HOSPITAL_COMMUNITY): Payer: Self-pay

## 2018-06-29 ENCOUNTER — Other Ambulatory Visit: Payer: Self-pay

## 2018-06-29 DIAGNOSIS — M24661 Ankylosis, right knee: Secondary | ICD-10-CM | POA: Diagnosis not present

## 2018-06-29 DIAGNOSIS — Z01812 Encounter for preprocedural laboratory examination: Secondary | ICD-10-CM | POA: Diagnosis not present

## 2018-06-29 HISTORY — DX: Bariatric surgery status: Z98.84

## 2018-06-29 HISTORY — DX: Type 2 diabetes mellitus without complications: E11.9

## 2018-06-29 HISTORY — DX: Asymptomatic varicose veins of bilateral lower extremities: I83.93

## 2018-06-29 HISTORY — DX: Hyperlipidemia, unspecified: E78.5

## 2018-06-29 HISTORY — DX: Venous insufficiency (chronic) (peripheral): I87.2

## 2018-06-29 HISTORY — DX: Long term (current) use of insulin: Z79.4

## 2018-06-29 LAB — CBC
HCT: 39.1 % (ref 36.0–46.0)
Hemoglobin: 13.6 g/dL (ref 12.0–15.0)
MCH: 33 pg (ref 26.0–34.0)
MCHC: 34.8 g/dL (ref 30.0–36.0)
MCV: 94.9 fL (ref 78.0–100.0)
Platelets: 289 10*3/uL (ref 150–400)
RBC: 4.12 MIL/uL (ref 3.87–5.11)
RDW: 12.3 % (ref 11.5–15.5)
WBC: 3.9 10*3/uL — AB (ref 4.0–10.5)

## 2018-06-29 LAB — SURGICAL PCR SCREEN
MRSA, PCR: NEGATIVE
STAPHYLOCOCCUS AUREUS: NEGATIVE

## 2018-06-29 LAB — BASIC METABOLIC PANEL
ANION GAP: 7 (ref 5–15)
BUN: 11 mg/dL (ref 8–23)
CALCIUM: 9.3 mg/dL (ref 8.9–10.3)
CO2: 31 mmol/L (ref 22–32)
Chloride: 105 mmol/L (ref 98–111)
Creatinine, Ser: 0.65 mg/dL (ref 0.44–1.00)
Glucose, Bld: 215 mg/dL — ABNORMAL HIGH (ref 70–99)
Potassium: 4.3 mmol/L (ref 3.5–5.1)
SODIUM: 143 mmol/L (ref 135–145)

## 2018-06-29 LAB — GLUCOSE, CAPILLARY: GLUCOSE-CAPILLARY: 160 mg/dL — AB (ref 70–99)

## 2018-06-29 NOTE — Progress Notes (Addendum)
Paged diabetic coordinator for advised on pt's tresiba injection that she does in am daily and per pt stated fasting am blood glucose 80.  Received called back from diabetic coordinator, rhonda, relayed information and that pt surgery is not until 1610 in afternoon for her surgery and having clear liquids until 1010 am dos.  Per her advise have pt not do her tresiba am dos and may use have dose scaling scale for humalog if needed day of surgery.  Pt verbalized understanding instructions no tresiba am dos and humalog 1/2 dose sliding scale if needed.

## 2018-06-30 LAB — HEMOGLOBIN A1C
Hgb A1c MFr Bld: 7.2 % — ABNORMAL HIGH (ref 4.8–5.6)
Mean Plasma Glucose: 160 mg/dL

## 2018-07-05 ENCOUNTER — Encounter (HOSPITAL_COMMUNITY)
Admission: RE | Disposition: A | Discharge status: Home / Self Care | Payer: Self-pay | Source: Ambulatory Visit | Attending: Orthopedic Surgery

## 2018-07-05 ENCOUNTER — Ambulatory Visit (HOSPITAL_COMMUNITY): Payer: 59 | Admitting: Anesthesiology

## 2018-07-05 ENCOUNTER — Ambulatory Visit (HOSPITAL_COMMUNITY)
Admission: RE | Admit: 2018-07-05 | Discharge: 2018-07-05 | Disposition: A | Discharge status: Home / Self Care | Payer: 59 | Source: Ambulatory Visit | Attending: Orthopedic Surgery | Admitting: Orthopedic Surgery

## 2018-07-05 ENCOUNTER — Encounter (HOSPITAL_COMMUNITY): Payer: Self-pay | Admitting: General Practice

## 2018-07-05 DIAGNOSIS — J45909 Unspecified asthma, uncomplicated: Secondary | ICD-10-CM | POA: Insufficient documentation

## 2018-07-05 DIAGNOSIS — M1990 Unspecified osteoarthritis, unspecified site: Secondary | ICD-10-CM | POA: Insufficient documentation

## 2018-07-05 DIAGNOSIS — I872 Venous insufficiency (chronic) (peripheral): Secondary | ICD-10-CM | POA: Diagnosis not present

## 2018-07-05 DIAGNOSIS — E039 Hypothyroidism, unspecified: Secondary | ICD-10-CM | POA: Insufficient documentation

## 2018-07-05 DIAGNOSIS — Z79899 Other long term (current) drug therapy: Secondary | ICD-10-CM | POA: Insufficient documentation

## 2018-07-05 DIAGNOSIS — Z794 Long term (current) use of insulin: Secondary | ICD-10-CM | POA: Insufficient documentation

## 2018-07-05 DIAGNOSIS — E785 Hyperlipidemia, unspecified: Secondary | ICD-10-CM | POA: Insufficient documentation

## 2018-07-05 DIAGNOSIS — M24661 Ankylosis, right knee: Secondary | ICD-10-CM | POA: Diagnosis not present

## 2018-07-05 DIAGNOSIS — Z9884 Bariatric surgery status: Secondary | ICD-10-CM | POA: Insufficient documentation

## 2018-07-05 DIAGNOSIS — I10 Essential (primary) hypertension: Secondary | ICD-10-CM | POA: Diagnosis not present

## 2018-07-05 DIAGNOSIS — E119 Type 2 diabetes mellitus without complications: Secondary | ICD-10-CM | POA: Insufficient documentation

## 2018-07-05 HISTORY — PX: KNEE CLOSED REDUCTION: SHX995

## 2018-07-05 LAB — GLUCOSE, CAPILLARY: GLUCOSE-CAPILLARY: 95 mg/dL (ref 70–99)

## 2018-07-05 SURGERY — MANIPULATION, KNEE, CLOSED
Anesthesia: General | Site: Knee | Laterality: Right

## 2018-07-05 MED ORDER — ONDANSETRON HCL 4 MG/2ML IJ SOLN
INTRAMUSCULAR | Status: DC | PRN
  Administered 2018-07-05: 4 mg via INTRAVENOUS

## 2018-07-05 MED ORDER — FENTANYL CITRATE (PF) 100 MCG/2ML IJ SOLN
INTRAMUSCULAR | Status: AC
  Filled 2018-07-05: qty 2

## 2018-07-05 MED ORDER — HYDROMORPHONE HCL 1 MG/ML IJ SOLN
INTRAMUSCULAR | Status: AC
  Filled 2018-07-05: qty 1

## 2018-07-05 MED ORDER — MIDAZOLAM HCL 2 MG/2ML IJ SOLN
INTRAMUSCULAR | Status: AC
  Filled 2018-07-05: qty 2

## 2018-07-05 MED ORDER — HYDROCODONE-ACETAMINOPHEN 5-325 MG PO TABS: 1.0000 | ORAL_TABLET | Freq: Four times a day (QID) | ORAL | 0 refills | Status: DC | PRN

## 2018-07-05 MED ORDER — ONDANSETRON HCL 4 MG/2ML IJ SOLN
INTRAMUSCULAR | Status: AC
  Filled 2018-07-05: qty 2

## 2018-07-05 MED ORDER — MIDAZOLAM HCL 5 MG/5ML IJ SOLN
INTRAMUSCULAR | Status: DC | PRN
  Administered 2018-07-05 (×2): 1 mg via INTRAVENOUS

## 2018-07-05 MED ORDER — SODIUM CHLORIDE 0.9 % IV SOLN
INTRAVENOUS | Status: DC
  Administered 2018-07-05: 16:00:00 via INTRAVENOUS

## 2018-07-05 MED ORDER — PROPOFOL 10 MG/ML IV BOLUS
INTRAVENOUS | Status: AC
  Filled 2018-07-05: qty 20

## 2018-07-05 MED ORDER — HYDROMORPHONE HCL 1 MG/ML IJ SOLN
0.2500 mg | INTRAMUSCULAR | Status: DC | PRN
  Administered 2018-07-05 (×2): 0.5 mg via INTRAVENOUS

## 2018-07-05 MED ORDER — LACTATED RINGERS IV SOLN
Freq: Once | INTRAVENOUS | Status: AC
  Administered 2018-07-05: 14:00:00 via INTRAVENOUS

## 2018-07-05 MED ORDER — FENTANYL CITRATE (PF) 100 MCG/2ML IJ SOLN
INTRAMUSCULAR | Status: DC | PRN
  Administered 2018-07-05: 25 ug via INTRAVENOUS

## 2018-07-05 MED ORDER — PROPOFOL 10 MG/ML IV BOLUS
INTRAVENOUS | Status: DC | PRN
  Administered 2018-07-05: 150 mg via INTRAVENOUS

## 2018-07-05 MED ORDER — CHLORHEXIDINE GLUCONATE 4 % EX LIQD: 60.0000 mL | Freq: Once | CUTANEOUS | Status: DC

## 2018-07-05 MED ORDER — POVIDONE-IODINE 10 % EX SWAB: 2.0000 "application " | Freq: Once | CUTANEOUS | Status: DC

## 2018-07-05 SURGICAL SUPPLY — 14 items
BANDAGE ADH SHEER 1  50/CT (GAUZE/BANDAGES/DRESSINGS) IMPLANT
COVER SURGICAL LIGHT HANDLE (MISCELLANEOUS) ×3 IMPLANT
GAUZE SPONGE 4X4 12PLY STRL (GAUZE/BANDAGES/DRESSINGS) IMPLANT
GLOVE BIO SURGEON STRL SZ8 (GLOVE) ×3 IMPLANT
GLOVE BIOGEL PI IND STRL 7.0 (GLOVE) ×1 IMPLANT
GLOVE BIOGEL PI IND STRL 8 (GLOVE) ×1 IMPLANT
GLOVE BIOGEL PI INDICATOR 7.0 (GLOVE) ×2
GLOVE BIOGEL PI INDICATOR 8 (GLOVE) ×2
GLOVE SURG SS PI 7.0 STRL IVOR (GLOVE) ×3 IMPLANT
GOWN STRL REUS W/TWL LRG LVL3 (GOWN DISPOSABLE) ×3 IMPLANT
NDL SAFETY ECLIPSE 18X1.5 (NEEDLE) IMPLANT
NEEDLE HYPO 18GX1.5 SHARP (NEEDLE)
SWABSTICK PVP IODINE (MISCELLANEOUS) ×3 IMPLANT
SYR CONTROL 10ML LL (SYRINGE) IMPLANT

## 2018-07-05 NOTE — Anesthesia Preprocedure Evaluation (Addendum)
Anesthesia Evaluation  Patient identified by MRN, date of birth, ID band Patient awake    Reviewed: Allergy & Precautions, H&P , NPO status , Patient's Chart, lab work & pertinent test results  History of Anesthesia Complications (+) PONV  Airway Mallampati: II  TM Distance: >3 FB Neck ROM: Full    Dental no notable dental hx. (+) Teeth Intact, Dental Advisory Given   Pulmonary asthma ,    Pulmonary exam normal breath sounds clear to auscultation       Cardiovascular hypertension, Pt. on medications + Peripheral Vascular Disease   Rhythm:Regular Rate:Normal     Neuro/Psych negative neurological ROS  negative psych ROS   GI/Hepatic negative GI ROS, Neg liver ROS,   Endo/Other  diabetes, Insulin DependentHypothyroidism   Renal/GU negative Renal ROS  negative genitourinary   Musculoskeletal  (+) Arthritis , Osteoarthritis,    Abdominal   Peds  Hematology negative hematology ROS (+)   Anesthesia Other Findings   Reproductive/Obstetrics negative OB ROS                            Anesthesia Physical Anesthesia Plan  ASA: III  Anesthesia Plan: General   Post-op Pain Management:    Induction: Intravenous  PONV Risk Score and Plan: 4 or greater and Ondansetron, Midazolam, Treatment may vary due to age or medical condition and TIVA  Airway Management Planned: Mask  Additional Equipment:   Intra-op Plan:   Post-operative Plan:   Informed Consent: I have reviewed the patients History and Physical, chart, labs and discussed the procedure including the risks, benefits and alternatives for the proposed anesthesia with the patient or authorized representative who has indicated his/her understanding and acceptance.   Dental advisory given  Plan Discussed with: CRNA  Anesthesia Plan Comments:       Anesthesia Quick Evaluation

## 2018-07-05 NOTE — Op Note (Signed)
  OPERATIVE REPORT   PREOPERATIVE DIAGNOSIS: Arthrofibrosis, Right  knee.   POSTOPERATIVE DIAGNOSIS: Arthrofibrosis, Right knee.   PROCEDURE:  Right  knee closed manipulation.   SURGEON: Isabele Lollar, MD   ASSISTANT: None.   ANESTHESIA: General.   COMPLICATIONS: None.   CONDITION: Stable to Recovery.   Pre-manipulation range of motion is 5-85.  Post-manipulation range of  Motion is 0-125  PROCEDURE IN DETAIL: After successful administration of general  anesthetic, exam under anesthesia was performed showing range of motion  5-85 degrees. I then placed my chest against the proximal tibia,  flexing the knee with audible lysis of adhesions. I was easily able to  get the knee flexed to 125  degrees. I then put the knee back in extension and with some  patellar manipulation and gentle pressure got to full  Extension.The patient was subsequently awakened and transported to Recovery in  stable condition.    

## 2018-07-05 NOTE — Anesthesia Postprocedure Evaluation (Signed)
Anesthesia Post Note  Patient: Ian [place] P Mohamud  Procedure(s) Performed: CLOSED MANIPULATION RIGHT KNEE (Right Knee)     Patient location during evaluation: PACU Anesthesia Type: General Level of consciousness: awake and alert Pain management: pain level controlled Vital Signs Assessment: post-procedure vital signs reviewed and stable Respiratory status: spontaneous breathing, nonlabored ventilation and respiratory function stable Cardiovascular status: blood pressure returned to baseline and stable Postop Assessment: no apparent nausea or vomiting Anesthetic complications: no    Last Vitals:  Vitals:   07/05/18 1645 07/05/18 1718  BP: (!) 181/70 (!) 192/65  Pulse: (!) 55 (!) 52  Resp:    Temp:    SpO2: 100% 100%    Last Pain:  Vitals:   07/05/18 1718  TempSrc:   PainSc: 3                  Ilai Hiller,W. EDMOND

## 2018-07-05 NOTE — Transfer of Care (Signed)
Immediate Anesthesia Transfer of Care Note  Patient: Rebecca Camacho  Procedure(s) Performed: Procedure(s) with comments: CLOSED MANIPULATION RIGHT KNEE (Right) - 50mn  Patient Location: PACU  Anesthesia Type:MAC  Level of Consciousness:  sedated, patient cooperative and responds to stimulation  Airway & Oxygen Therapy:Patient Spontanous Breathing and Patient connected to face mask oxgen  Post-op Assessment:  Report given to PACU RN and Post -op Vital signs reviewed and stable  Post vital signs:  Reviewed and stable  Last Vitals:  Vitals:   07/05/18 1352 07/05/18 1620  BP: (!) 164/62 (!) 134/55  Pulse: (!) 48 (!) 46  Resp: 16 15  Temp: 36.6 C   SpO2: 1242%1353%   Complications: No apparent anesthesia complications

## 2018-07-05 NOTE — H&P (Signed)
CC- [place] Zonia KiefStephens is a 62 y.o. female who presents with right knee stiffness.  HPI- . Knee Pain: Patient presents with stiffness involving the  right knee. Onset of the symptoms was several months ago. Inciting event: She had a right total knee arthroplasy approximately 3 months ago and has had difficulty gaining range of motion. She has had adequate physical therapy and has not been able to gain any flexion past 95 degrees in the past 6 weeks. She desires greater range of motion for her functional activities and presents now for closed manipulation.   Past Medical History:  Diagnosis Date  . Arthritis   . Asthma   . Chronic venous insufficiency    vascular-- dr Edilia Bodickson-- bilateral deep vein and superficial reflux  . Hyperlipidemia   . Hypertension   . Hypothyroidism   . PONV (postoperative nausea and vomiting)    severe  . 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 Type 2 diabetes mellitus treated with insulin (HCC)   . Varicose veins of both lower extremities     Past Surgical History:  Procedure Laterality Date  . ACHILLES TENDON REPAIR Left mid 2000s  . CARPAL TUNNEL RELEASE Bilateral 1988  . CATARACT EXTRACTION W/ INTRAOCULAR LENS  IMPLANT, BILATERAL  10/ 2018;  2014 approx.  . COLONOSCOPY  last one 2010 approx.  Marland Kitchen. GASTRIC BYPASS  08/2012  . KNEE ARTHROSCOPY Right early 2000   AND  RIGHT ACHILLES TENDON REPAIR   . ROTATOR CUFF REPAIR Right 2000  . TONSILLECTOMY  child  . TOTAL KNEE ARTHROPLASTY Right 04/05/2018   Procedure: RIGHT TOTAL KNEE ARTHROPLASTY;  Surgeon: Ollen GrossAluisio, Trinty Marken, MD;  Location: [facility];  Service: Orthopedics;  Laterality: Right;  . WISDOM TOOTH EXTRACTION      Prior to Admission medications   Medication Sig Start Date End Date Taking? Authorizing Provider  acetaminophen (TYLENOL) 500 MG tablet Take 1,000 mg by mouth 2 (two) times daily as needed for moderate pain.   Yes [provider]  albuterol (PROVENTIL HFA) 108 (90 BASE) MCG/ACT inhaler Inhale 2 puffs  into the lungs every 6 (six) hours as needed for wheezing or shortness of breath.  11/15/12  Yes [provider]  Biotin 5000 MCG CAPS Take 5,000 mcg by mouth daily.   Yes [provider]  budesonide-formoterol (SYMBICORT) 160-4.5 MCG/ACT inhaler Inhale 1 puff into the lungs every morning.    Yes [provider]  Calcium Carb-Cholecalciferol (CALCIUM 500 +D) 500-400 MG-UNIT TABS Take 1 tablet by mouth at bedtime.   Yes [provider]  Cholecalciferol (VITAMIN D3) 5000 units CAPS Take 10,000 Units by mouth daily at 2 PM.   Yes [provider]  Cyanocobalamin (B-12) 2500 MCG TABS Take 2,500 mcg by mouth every Wednesday.   Yes [provider]  diphenhydrAMINE (BENADRYL) 25 MG tablet Take 25 mg by mouth daily as needed for allergies.   Yes [provider]  ferrous sulfate 325 (65 FE) MG EC tablet Take 325 mg by mouth daily at 2 PM.    Yes [provider]  insulin degludec (TRESIBA FLEXTOUCH) 100 UNIT/ML SOPN FlexTouch Pen Inject 21 Units into the skin every morning.    Yes [provider]  insulin lispro (HUMALOG) 100 UNIT/ML injection Inject 2-7 Units into the skin 3 (three) times daily as needed for high blood sugar. 100-150 = 2 units, 151-200 = 4 units, 201-250= 6 units, >250 = 7 units   Yes [provider]  levothyroxine (SYNTHROID, LEVOTHROID) 125 MCG  tablet Take 125 mcg by mouth daily before breakfast.   Yes [provider]  losartan (COZAAR) 50 MG tablet Take 50 mg by mouth daily.  02/14/13  Yes [provider]  metroNIDAZOLE (METROGEL) 1 % gel Apply 1 application topically daily.    Yes [provider]  Multiple Vitamin (MULTIVITAMIN) tablet Take 4 tablets by mouth daily at 2 PM.    Yes [provider]  pravastatin (PRAVACHOL) 40 MG tablet Take 40 mg by mouth every morning.  09/07/14  Yes [provider]  methocarbamol (ROBAXIN) 500 MG tablet Take 1 tablet (500 mg  total) by mouth every 6 (six) hours as needed for muscle spasms. Patient not taking: Reported on 06/25/2018 04/07/18   Derenda Fennel, PA  ONE TOUCH ULTRA TEST test strip  04/03/15   [provider]  Dola Argyle LANCETS FINE MISC  03/28/15   [provider]  oxyCODONE (OXY IR/ROXICODONE) 5 MG immediate release tablet Take 1-2 tablets (5-10 mg total) by mouth every 6 (six) hours as needed for moderate pain or severe pain. Patient not taking: Reported on 05/10/2018 04/07/18   Edmisten, Lyn Hollingshead, PA  traMADol (ULTRAM) 50 MG tablet Take 1-2 tablets (50-100 mg total) by mouth every 6 (six) hours as needed for moderate pain (pain not relieved with oxycodone). Patient not taking: Reported on 06/25/2018 04/07/18   Edmisten, Lyn Hollingshead, PA   KNEE EXAM antalgic gait, no warmth or  effusion, reduced range of motion (5-95), collateral ligaments intact  Physical Examination: General appearance - alert, well appearing, and in no distress Mental status - alert, oriented to person, place, and time Chest - clear to auscultation, no wheezes, rales or rhonchi, symmetric air entry Heart - normal rate, regular rhythm, normal S1, S2, no murmurs, rubs, clicks or gallops Abdomen - soft, nontender, nondistended, no masses or organomegaly Neurological - alert, oriented, normal speech, no focal findings or movement disorder noted   Asessment/Plan--- Right knee arthrofibrosis- - Plan right knee closed manipulation. Procedure risks and potential comps discussed with patient who elects to proceed. Goals are decreased pain and increased function with a high likelihood of achieving both

## 2018-07-06 ENCOUNTER — Encounter (HOSPITAL_COMMUNITY): Payer: Self-pay | Admitting: Orthopedic Surgery

## 2018-08-18 ENCOUNTER — Ambulatory Visit: Payer: 59 | Admitting: Vascular Surgery

## 2018-10-28 ENCOUNTER — Ambulatory Visit: Payer: 59 | Admitting: Podiatry

## 2018-10-28 ENCOUNTER — Other Ambulatory Visit: Payer: Self-pay

## 2018-10-28 ENCOUNTER — Encounter: Payer: Self-pay | Admitting: Podiatry

## 2018-10-28 VITALS — BP 138/66 | HR 66 | Ht 64.0 in | Wt 140.0 lb

## 2018-10-28 DIAGNOSIS — J45909 Unspecified asthma, uncomplicated: Secondary | ICD-10-CM | POA: Insufficient documentation

## 2018-10-28 DIAGNOSIS — B070 Plantar wart: Secondary | ICD-10-CM | POA: Diagnosis not present

## 2018-10-28 DIAGNOSIS — N951 Menopausal and female climacteric states: Secondary | ICD-10-CM | POA: Insufficient documentation

## 2018-10-28 DIAGNOSIS — E785 Hyperlipidemia, unspecified: Secondary | ICD-10-CM | POA: Insufficient documentation

## 2018-10-28 DIAGNOSIS — E119 Type 2 diabetes mellitus without complications: Secondary | ICD-10-CM | POA: Insufficient documentation

## 2018-10-28 NOTE — Progress Notes (Signed)
Subjective:    Patient ID: Rebecca Camacho, female    DOB: 10-16-1956, 63 y.o.   MRN: [id number]  HPI 63 year old female presents the office today for concerns of a wart foot pointing to submetatarsal 5 area.  This is been ongoing for last 1 year.  She is tried over-the-counter wart pads without any significant treatment.  She does get some tenderness with pressure to the area.  She denies any redness or drainage. She has no other concerns today.    Review of Systems  All other systems reviewed and are negative.  Past Medical History:  Diagnosis Date  . Arthritis   . Asthma   . Chronic venous insufficiency    vascular-- dr Edilia Bo-- bilateral deep vein and superficial reflux  . Hyperlipidemia   . Hypertension   . Hypothyroidism   . PONV (postoperative nausea and vomiting)    severe  . 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 Type 2 diabetes mellitus treated with insulin ([facility])   . Varicose veins of both lower extremities     Past Surgical History:  Procedure Laterality Date  . ACHILLES TENDON REPAIR Left mid 2000s  . CARPAL TUNNEL RELEASE Bilateral 1988  . CATARACT EXTRACTION W/ INTRAOCULAR LENS  IMPLANT, BILATERAL  10/ 2018;  2014 approx.  . COLONOSCOPY  last one 2010 approx.  Marland Kitchen GASTRIC BYPASS  08/2012  . KNEE ARTHROSCOPY Right early 2000   AND  RIGHT ACHILLES TENDON REPAIR   . KNEE CLOSED REDUCTION Right 07/05/2018   Procedure: CLOSED MANIPULATION RIGHT KNEE;  Surgeon: Ollen Gross, MD;  Location: [facility];  Service: Orthopedics;  Laterality: Right;   . ROTATOR CUFF REPAIR Right 2000  . TONSILLECTOMY  child  . TOTAL KNEE ARTHROPLASTY Right 04/05/2018   Procedure: RIGHT TOTAL KNEE ARTHROPLASTY;  Surgeon: Ollen Gross, MD;  Location: [facility];  Service: Orthopedics;  Laterality: Right;  . WISDOM TOOTH EXTRACTION       Current Outpatient Medications:  .  albuterol (PROVENTIL HFA) 108 (90 BASE) MCG/ACT inhaler, Inhale 2 puffs into the lungs every 6 (six) hours as needed for  wheezing or shortness of breath. , Disp: , Rfl:  .  Biotin 5000 MCG CAPS, Take 5,000 mcg by mouth daily., Disp: , Rfl:  .  budesonide-formoterol (SYMBICORT) 160-4.5 MCG/ACT inhaler, Inhale 1 puff into the lungs every morning. , Disp: , Rfl:  .  Calcium Carb-Cholecalciferol (CALCIUM 500 +D) 500-400 MG-UNIT TABS, Take 1 tablet by mouth at bedtime., Disp: , Rfl:  .  Cholecalciferol (VITAMIN D3) 5000 units CAPS, Take 10,000 Units by mouth daily at 2 PM., Disp: , Rfl:  .  Cyanocobalamin (B-12) 2500 MCG TABS, Take 2,500 mcg by mouth every Wednesday., Disp: , Rfl:  .  ferrous sulfate 325 (65 FE) MG EC tablet, Take 325 mg by mouth daily at 2 PM. , Disp: , Rfl:  .  insulin degludec (TRESIBA FLEXTOUCH) 100 UNIT/ML SOPN FlexTouch Pen, Inject 21 Units into the skin every morning. , Disp: , Rfl:  .  insulin lispro (HUMALOG) 100 UNIT/ML injection, Inject 2-7 Units into the skin 3 (three) times daily as needed for high blood sugar. 100-150 = 2 units, 151-200 = 4 units, 201-250= 6 units, >250 = 7 units, Disp: , Rfl:  .  levothyroxine (SYNTHROID, LEVOTHROID) 125 MCG tablet, Take 125 mcg by mouth daily before breakfast., Disp: , Rfl:  .  losartan (COZAAR) 50 MG tablet, Take 50 mg by mouth daily. , Disp: , Rfl:  .  metroNIDAZOLE (  METROGEL) 1 % gel, Apply 1 application topically daily. , Disp: , Rfl:  .  Multiple Vitamin (MULTIVITAMIN) tablet, Take 4 tablets by mouth daily at 2 PM. , Disp: , Rfl:  .  ONE TOUCH ULTRA TEST test strip, , Disp: , Rfl:  .  ONETOUCH DELICA LANCETS FINE MISC, , Disp: , Rfl:  .  pravastatin (PRAVACHOL) 40 MG tablet, Take 40 mg by mouth every morning. , Disp: , Rfl:   No Known Allergies       Objective:   Physical Exam General: AAO x3, NAD  Dermatological: Hyperkeratotic lesion on the left foot proximal to the 5th metatarsal head.  Upon debridement there was evidence of verruca.  There are no open sores, no preulcerative lesions, no rash or signs of infection present.  Vascular:  Dorsalis Pedis artery and Posterior Tibial artery pedal pulses are 2/4 bilateral with immedate capillary fill time.  There is no pain with calf compression, swelling, warmth, erythema.   Neruologic: Grossly intact via light touch bilateral.  Protective threshold with Semmes Wienstein monofilament intact to all pedal sites bilateral.   Musculoskeletal: No gross boney pedal deformities bilateral. No pain, crepitus, or limitation noted with foot and ankle range of motion bilateral. Muscular strength 5/5 in all groups tested bilateral.  Gait: Unassisted, Nonantalgic.      Assessment & Plan:  63 year old female left foot verruca -Treatment options discussed including all alternatives, risks, and complications -Etiology of symptoms were discussed -Lesion was sharply debrided utilizing #[id number] with scalpel without any complications.  The area was cleaned with alcohol.  It was applied followed by an occlusive bandage.  Post procedure instructions were discussed.  Monitoring signs or symptoms of infection.  Offloading pads dispensed.  Return in about 3 weeks (around 11/18/2018).  Vivi BarrackMatthew R Aimar Shrewsbury DPM

## 2018-10-28 NOTE — Patient Instructions (Signed)
Take dressing off in 8 hours and wash the foot with soap and water. If it is hurting or becomes uncomfortable before the 8 hours, go ahead and remove the bandage and wash the area.  If it blisters, apply antibiotic ointment and a band-aid.  Monitor for any signs/symptoms of infection. Call the office immediately if any occur or go directly to the emergency room. Call with any questions/concerns.   

## 2018-11-06 DIAGNOSIS — B070 Plantar wart: Secondary | ICD-10-CM | POA: Insufficient documentation

## 2018-11-18 ENCOUNTER — Encounter: Payer: Self-pay | Admitting: Podiatry

## 2018-11-18 ENCOUNTER — Ambulatory Visit: Payer: 59 | Admitting: Podiatry

## 2018-11-18 DIAGNOSIS — M216X9 Other acquired deformities of unspecified foot: Secondary | ICD-10-CM | POA: Diagnosis not present

## 2018-11-18 DIAGNOSIS — M7750 Other enthesopathy of unspecified foot: Secondary | ICD-10-CM

## 2018-11-18 DIAGNOSIS — B070 Plantar wart: Secondary | ICD-10-CM

## 2018-11-18 NOTE — Patient Instructions (Signed)
Keep the bandage on for 24 hours. At that time, remove and clean with soap and water. If it hurts or burns before 24 hours go ahead and remove the bandage and wash with soap and water. Keep the area clean. If there is any blistering cover with antibiotic ointment and a bandage. Monitor for any redness, drainage, or other signs of infection. Call the office if any are to occur. If you have any questions, please call the office at [phone].  

## 2018-11-19 NOTE — Progress Notes (Signed)
Subjective: 63 year old female presents the office today for evaluation of a wart, skin lesion on the left foot.  She states that the medication applied, Cantharone Plus appointment did not help.  She states that it did help from treatment of the lesion and the pain improved but overall still present.  She also states that she is walking on the outside of her feet and she thinks this is because this pain the onset of her feet.  This is been a chronic issue for her.  Denies any recent injury or trauma or any swelling. Denies any systemic complaints such as fevers, chills, nausea, vomiting. No acute changes since last appointment, and no other complaints at this time.   Objective: AAO x3, NAD DP/PT pulses palpable bilaterally, CRT less than 3 seconds There is a hyperkeratotic lesion with evidence of verruca present to just proximal fifth metatarsal head.  There is no edema, erythema, increase in warmth there is no drainage or pus.  On weightbearing evaluation she supinate and have a more cavus foot type and putting more pressure of the lateral aspect of the foot and subjectively she is been discomfort on the lateral aspect of her left foot.  There is no area of pinpoint tenderness or pain to vibratory sensation. No open lesions or pre-ulcerative lesions.  No pain with calf compression, swelling, warmth, erythema  Assessment: Left foot verruca, cavus foot type with tendinitis  Plan: -All treatment options discussed with the patient including all alternatives, risks, complications.  -The lesion was sharply debrided today without any complications or bleeding on the left foot.  Area was cleaned with alcohol.  A pad was placed followed by salicylic acid and a bandage.  Post procedure instructions were discussed.  Monitoring signs or symptoms of infection. -We discussed her overall foot type.  She would be interested in orthotics.  Check orthotic coverage and let her know.  We also discussed shoe  modifications. -Patient encouraged to call the office with any questions, concerns, change in symptoms.   Vivi Barrack DPM

## 2018-11-29 ENCOUNTER — Telehealth: Payer: Self-pay | Admitting: Podiatry

## 2018-11-29 NOTE — Telephone Encounter (Signed)
Pt left message on 2.7.2020 returning my call about orthotic coverage.  I returned call and orthotics are covered if pt is diabetic. Pt stated she was a diabetic but I did not see it documented in Dr Gabriel Rung notes. I will have it added and call to get coverage benefits since pt is diabetic. I told pt I would call her back.

## 2018-11-29 NOTE — Telephone Encounter (Signed)
Covered @ 80% after 600.00 deductible(pt has met 350.88) I have called pt and she is aware and is going to think about it and call to schedule an appt. She did ask me if medicare would cover them because she is going onto medicare in April and I told her they do not cover them.

## 2018-12-08 ENCOUNTER — Encounter: Payer: Self-pay | Admitting: Sports Medicine

## 2018-12-08 ENCOUNTER — Ambulatory Visit: Payer: 59 | Admitting: Sports Medicine

## 2018-12-08 VITALS — BP 152/70 | Ht 64.0 in | Wt 145.0 lb

## 2018-12-08 DIAGNOSIS — M216X2 Other acquired deformities of left foot: Secondary | ICD-10-CM

## 2018-12-08 DIAGNOSIS — M21172 Varus deformity, not elsewhere classified, left ankle: Secondary | ICD-10-CM

## 2018-12-08 MED ORDER — DICLOFENAC SODIUM 1 % TD GEL: 2.0000 g | Freq: Two times a day (BID) | TRANSDERMAL | 1 refills | Status: DC | PRN

## 2018-12-08 NOTE — Patient Instructions (Signed)
It is great to see you today for your office visit.  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  We placed you in temporary orthotics with scaphoid pads and a lateral wedge for support of your foot.  We also gave you posterior tibialis tendon exercises to be completed daily.  I did provide a prescription for Voltaren gel which is a topical anti-inflammatory to be used as prescribed.  You may follow-up in 3 to 4 months for custom orthotics if you desire.

## 2018-12-08 NOTE — Assessment & Plan Note (Signed)
-  temporary orthotics made size 7.5-8.5 with small scaphoid pads and L lateral wedge -We will try these corrections for the next 1 to 2 months to see if this improves her foot and ankle pain.   -I am also recommending posterior tibialis tendon exercises for strengthening. -Topical Voltaren gel as prescribed as needed for an anti-inflammatory effect.  Unable to tolerate oral anti-inflammatories secondary to history of gastric bypass -She would be a good candidate for custom orthotics.  She will tentatively follow-up in approximately 3 to 4 months for custom orthotics -If not improving, I would recommend left foot and ankle x-rays to evaluate for midfoot arthritis

## 2018-12-08 NOTE — Progress Notes (Signed)
Rebecca Camacho - 63 y.o. female MRN [id number]  Date of birth: [birth year]   Chief complaint: Foot and ankle pain, L  SUBJECTIVE:    History of present illness: 62 year old female with a longstanding history of left foot and ankle pain.  She was last seen in the office over 3 years ago and diagnosed with peroneal tendinitis as well as hindfoot varus.  She has been managed with a home exercise program and temporary/custom orthotics in the past.  She has not worn her orthotics in over a year.  She states her pain is localized to the lateral aspect of the hindfoot and midfoot region.  She rates her pain 3 out of 10 and is described as a sharp burning sensation which is worse with prolonged walking.  It does improve with relative rest from exercise and impact activities.  She is an avid Counselling psychologist and reports only mild symptoms with swimming on a daily basis.  She is here today to discuss orthotics and further treatment of her foot/ankle pain.  Denies any significant weakness.  She does have a history of walking with slight external rotation of her leg since she was a child.  No numbness or tingling of the extremity.  No foot drop.  She has a history of gastric bypass surgery and has lost over 85 pounds within the last year.  She is unable to take anti-inflammatories because of this.   Review of systems:  A complete review of systems 10 point was performed and pertinent positives and negatives discussed above in the HPI.   Past medical history: Diabetes mellitus type 2 insulin-dependent, hypertension, asthma, hypothyroidism, chronic venous insufficiency Past surgical history: History of a right total knee arthroplasty in 2019, history of gastric bypass surgery in 2013, history of rotator cuff repair surgery in 2000 Past family history: Diabetes, coronary artery disease, myocardial infarction, hypertension, hyperlipidemia, lung cancer Social history: She is a non-smoker and works on her feet daily at  [employer]  Medications: Symbicort, albuterol, calcium and vitamin D, ferrous sulfate, insulin, losartan, pravastatin Allergies: No known drug allergies  OBJECTIVE:  Physical exam: Vital signs are reviewed. BP (!) 152/70   Ht 5\' 4"  (1.626 m)   Wt 145 lb (65.8 kg)   BMI 24.89 kg/m   Gen.: Alert, oriented, appears stated age, in no apparent distress HEENT: Moist oral mucosa Respiratory: Normal respirations, able to speak in full sentences Cardiac: Regular rate, distal pulses 2+ Integumentary: Varicose veins are noted on her lower extremities bilaterally, no rashes Neurologic:  Sensation is intact to light touch L4-S1 Gait: She walks with an externally rotated right foot and significant pronation bilaterally Psych: Normal affect, pleasant Musculoskeletal: Inspection of the left foot demonstrates significant hindfoot varus with pes planus and collapse of her medial longitudinal arch.  She has hammertoes of her second third and fourth digits.  She has gapping of her first and second digit.  Achilles tendon is intact and nontender.  Tenderness to palpation over her lateral midfoot and hindfoot.  Full range of motion ankle dorsiflexion plantarflexion.  Strength testing is 4.5 out of 5 of the posterior tibialis tendon.  Negative anterior drawer.  Negative ankle inversion eversion testing.  Neurovascularly intact.  Dorsalis pedis pulse +2.  Trace edema noted of the foot.    ASSESSMENT & PLAN: Acquired left hindfoot varus -temporary orthotics made size 7.5-8.5 with small scaphoid pads and L lateral wedge -We will try these corrections for the next 1 to 2 months to see if this  improves her foot and ankle pain.   -I am also recommending posterior tibialis tendon exercises for strengthening. -Topical Voltaren gel as prescribed as needed for an anti-inflammatory effect.  Unable to tolerate oral anti-inflammatories secondary to history of gastric bypass -She would be a good candidate for custom  orthotics.  She will tentatively follow-up in approximately 3 to 4 months for custom orthotics -If not improving, I would recommend left foot and ankle x-rays to evaluate for midfoot arthritis    Meds ordered this encounter  Medications  . diclofenac sodium (VOLTAREN) 1 % GEL    Sig: Apply 2 g topically 2 (two) times daily as needed.    Dispense:  100 g    Refill:  1      Gustavus Messing, DO Sports Medicine Fellow [place]  I was the preceptor for this visit and available for immediate consultation Marsa Aris, DO

## 2018-12-16 ENCOUNTER — Ambulatory Visit: Payer: 59 | Admitting: Podiatry

## 2019-01-25 DIAGNOSIS — E1139 Type 2 diabetes mellitus with other diabetic ophthalmic complication: Secondary | ICD-10-CM | POA: Diagnosis not present

## 2019-01-25 DIAGNOSIS — M25572 Pain in left ankle and joints of left foot: Secondary | ICD-10-CM | POA: Diagnosis not present

## 2019-01-25 DIAGNOSIS — E782 Mixed hyperlipidemia: Secondary | ICD-10-CM | POA: Diagnosis not present

## 2019-01-25 DIAGNOSIS — E039 Hypothyroidism, unspecified: Secondary | ICD-10-CM | POA: Diagnosis not present

## 2019-03-28 DIAGNOSIS — Z6825 Body mass index (BMI) 25.0-25.9, adult: Secondary | ICD-10-CM | POA: Diagnosis not present

## 2019-03-28 DIAGNOSIS — Z1231 Encounter for screening mammogram for malignant neoplasm of breast: Secondary | ICD-10-CM | POA: Diagnosis not present

## 2019-03-28 DIAGNOSIS — Z01419 Encounter for gynecological examination (general) (routine) without abnormal findings: Secondary | ICD-10-CM | POA: Diagnosis not present

## 2019-05-09 DIAGNOSIS — Z0120 Encounter for dental examination and cleaning without abnormal findings: Secondary | ICD-10-CM | POA: Diagnosis not present

## 2019-06-03 DIAGNOSIS — E1139 Type 2 diabetes mellitus with other diabetic ophthalmic complication: Secondary | ICD-10-CM | POA: Diagnosis not present

## 2019-06-03 DIAGNOSIS — E782 Mixed hyperlipidemia: Secondary | ICD-10-CM | POA: Diagnosis not present

## 2019-06-03 DIAGNOSIS — E038 Other specified hypothyroidism: Secondary | ICD-10-CM | POA: Diagnosis not present

## 2019-07-01 DIAGNOSIS — Z23 Encounter for immunization: Secondary | ICD-10-CM | POA: Diagnosis not present

## 2019-07-01 DIAGNOSIS — R82998 Other abnormal findings in urine: Secondary | ICD-10-CM | POA: Diagnosis not present

## 2019-07-01 DIAGNOSIS — E1139 Type 2 diabetes mellitus with other diabetic ophthalmic complication: Secondary | ICD-10-CM | POA: Diagnosis not present

## 2019-08-22 DIAGNOSIS — L821 Other seborrheic keratosis: Secondary | ICD-10-CM | POA: Diagnosis not present

## 2019-08-22 DIAGNOSIS — D225 Melanocytic nevi of trunk: Secondary | ICD-10-CM | POA: Diagnosis not present

## 2019-08-22 DIAGNOSIS — L84 Corns and callosities: Secondary | ICD-10-CM | POA: Diagnosis not present

## 2019-08-22 DIAGNOSIS — Z85828 Personal history of other malignant neoplasm of skin: Secondary | ICD-10-CM | POA: Diagnosis not present

## 2019-08-30 DIAGNOSIS — Z794 Long term (current) use of insulin: Secondary | ICD-10-CM | POA: Diagnosis not present

## 2019-08-30 DIAGNOSIS — I10 Essential (primary) hypertension: Secondary | ICD-10-CM | POA: Diagnosis not present

## 2019-08-30 DIAGNOSIS — E1139 Type 2 diabetes mellitus with other diabetic ophthalmic complication: Secondary | ICD-10-CM | POA: Diagnosis not present

## 2019-10-11 DIAGNOSIS — E1139 Type 2 diabetes mellitus with other diabetic ophthalmic complication: Secondary | ICD-10-CM | POA: Diagnosis not present

## 2019-10-11 DIAGNOSIS — I10 Essential (primary) hypertension: Secondary | ICD-10-CM | POA: Diagnosis not present

## 2019-10-11 DIAGNOSIS — Z794 Long term (current) use of insulin: Secondary | ICD-10-CM | POA: Diagnosis not present

## 2019-10-27 DIAGNOSIS — I10 Essential (primary) hypertension: Secondary | ICD-10-CM | POA: Diagnosis not present

## 2019-10-27 DIAGNOSIS — Z794 Long term (current) use of insulin: Secondary | ICD-10-CM | POA: Diagnosis not present

## 2019-10-27 DIAGNOSIS — E1139 Type 2 diabetes mellitus with other diabetic ophthalmic complication: Secondary | ICD-10-CM | POA: Diagnosis not present

## 2019-10-27 DIAGNOSIS — R05 Cough: Secondary | ICD-10-CM | POA: Diagnosis not present

## 2019-11-10 DIAGNOSIS — Z0120 Encounter for dental examination and cleaning without abnormal findings: Secondary | ICD-10-CM | POA: Diagnosis not present

## 2019-11-18 ENCOUNTER — Ambulatory Visit: Payer: 59

## 2019-11-26 ENCOUNTER — Ambulatory Visit: Payer: 59

## 2019-12-05 ENCOUNTER — Ambulatory Visit: Payer: 59

## 2020-01-10 DIAGNOSIS — I10 Essential (primary) hypertension: Secondary | ICD-10-CM | POA: Diagnosis not present

## 2020-01-10 DIAGNOSIS — E1139 Type 2 diabetes mellitus with other diabetic ophthalmic complication: Secondary | ICD-10-CM | POA: Diagnosis not present

## 2020-01-10 DIAGNOSIS — Z794 Long term (current) use of insulin: Secondary | ICD-10-CM | POA: Diagnosis not present

## 2020-02-14 DIAGNOSIS — I10 Essential (primary) hypertension: Secondary | ICD-10-CM | POA: Diagnosis not present

## 2020-02-14 DIAGNOSIS — E1139 Type 2 diabetes mellitus with other diabetic ophthalmic complication: Secondary | ICD-10-CM | POA: Diagnosis not present

## 2020-02-14 DIAGNOSIS — Z794 Long term (current) use of insulin: Secondary | ICD-10-CM | POA: Diagnosis not present

## 2020-02-28 DIAGNOSIS — Z794 Long term (current) use of insulin: Secondary | ICD-10-CM | POA: Diagnosis not present

## 2020-02-28 DIAGNOSIS — Z9884 Bariatric surgery status: Secondary | ICD-10-CM | POA: Diagnosis not present

## 2020-02-28 DIAGNOSIS — E1139 Type 2 diabetes mellitus with other diabetic ophthalmic complication: Secondary | ICD-10-CM | POA: Diagnosis not present

## 2020-02-28 DIAGNOSIS — I10 Essential (primary) hypertension: Secondary | ICD-10-CM | POA: Diagnosis not present

## 2020-04-03 DIAGNOSIS — Z1231 Encounter for screening mammogram for malignant neoplasm of breast: Secondary | ICD-10-CM | POA: Diagnosis not present

## 2020-05-02 DIAGNOSIS — E1139 Type 2 diabetes mellitus with other diabetic ophthalmic complication: Secondary | ICD-10-CM | POA: Diagnosis not present

## 2020-05-02 DIAGNOSIS — Z794 Long term (current) use of insulin: Secondary | ICD-10-CM | POA: Diagnosis not present

## 2020-05-02 DIAGNOSIS — I10 Essential (primary) hypertension: Secondary | ICD-10-CM | POA: Diagnosis not present

## 2020-05-10 DIAGNOSIS — E119 Type 2 diabetes mellitus without complications: Secondary | ICD-10-CM | POA: Diagnosis not present

## 2020-06-08 DIAGNOSIS — E039 Hypothyroidism, unspecified: Secondary | ICD-10-CM | POA: Diagnosis not present

## 2020-06-08 DIAGNOSIS — E782 Mixed hyperlipidemia: Secondary | ICD-10-CM | POA: Diagnosis not present

## 2020-06-08 DIAGNOSIS — E1165 Type 2 diabetes mellitus with hyperglycemia: Secondary | ICD-10-CM | POA: Diagnosis not present

## 2020-07-20 DIAGNOSIS — I10 Essential (primary) hypertension: Secondary | ICD-10-CM | POA: Diagnosis not present

## 2020-07-20 DIAGNOSIS — R82998 Other abnormal findings in urine: Secondary | ICD-10-CM | POA: Diagnosis not present

## 2020-07-20 DIAGNOSIS — E039 Hypothyroidism, unspecified: Secondary | ICD-10-CM | POA: Diagnosis not present

## 2020-07-20 DIAGNOSIS — E11319 Type 2 diabetes mellitus with unspecified diabetic retinopathy without macular edema: Secondary | ICD-10-CM | POA: Diagnosis not present

## 2020-07-20 DIAGNOSIS — Z0000 Encounter for general adult medical examination without abnormal findings: Secondary | ICD-10-CM | POA: Diagnosis not present

## 2020-07-20 DIAGNOSIS — Z794 Long term (current) use of insulin: Secondary | ICD-10-CM | POA: Diagnosis not present

## 2020-08-02 DIAGNOSIS — I10 Essential (primary) hypertension: Secondary | ICD-10-CM | POA: Diagnosis not present

## 2020-08-02 DIAGNOSIS — Z23 Encounter for immunization: Secondary | ICD-10-CM | POA: Diagnosis not present

## 2020-08-02 DIAGNOSIS — E11319 Type 2 diabetes mellitus with unspecified diabetic retinopathy without macular edema: Secondary | ICD-10-CM | POA: Diagnosis not present

## 2020-08-02 DIAGNOSIS — Z794 Long term (current) use of insulin: Secondary | ICD-10-CM | POA: Diagnosis not present

## 2020-08-30 DIAGNOSIS — Z85828 Personal history of other malignant neoplasm of skin: Secondary | ICD-10-CM | POA: Diagnosis not present

## 2020-08-30 DIAGNOSIS — L814 Other melanin hyperpigmentation: Secondary | ICD-10-CM | POA: Diagnosis not present

## 2020-08-30 DIAGNOSIS — D225 Melanocytic nevi of trunk: Secondary | ICD-10-CM | POA: Diagnosis not present

## 2020-08-30 DIAGNOSIS — L821 Other seborrheic keratosis: Secondary | ICD-10-CM | POA: Diagnosis not present

## 2020-09-11 DIAGNOSIS — Z0120 Encounter for dental examination and cleaning without abnormal findings: Secondary | ICD-10-CM | POA: Diagnosis not present

## 2020-09-12 DIAGNOSIS — Z1212 Encounter for screening for malignant neoplasm of rectum: Secondary | ICD-10-CM | POA: Diagnosis not present

## 2020-11-08 DIAGNOSIS — I10 Essential (primary) hypertension: Secondary | ICD-10-CM | POA: Diagnosis not present

## 2020-11-08 DIAGNOSIS — E11319 Type 2 diabetes mellitus with unspecified diabetic retinopathy without macular edema: Secondary | ICD-10-CM | POA: Diagnosis not present

## 2020-11-08 DIAGNOSIS — Z794 Long term (current) use of insulin: Secondary | ICD-10-CM | POA: Diagnosis not present

## 2020-12-11 DIAGNOSIS — E11319 Type 2 diabetes mellitus with unspecified diabetic retinopathy without macular edema: Secondary | ICD-10-CM | POA: Diagnosis not present

## 2020-12-11 DIAGNOSIS — M542 Cervicalgia: Secondary | ICD-10-CM | POA: Diagnosis not present

## 2020-12-11 DIAGNOSIS — E039 Hypothyroidism, unspecified: Secondary | ICD-10-CM | POA: Diagnosis not present

## 2020-12-11 DIAGNOSIS — I10 Essential (primary) hypertension: Secondary | ICD-10-CM | POA: Diagnosis not present

## 2021-02-07 DIAGNOSIS — I10 Essential (primary) hypertension: Secondary | ICD-10-CM | POA: Diagnosis not present

## 2021-02-07 DIAGNOSIS — E782 Mixed hyperlipidemia: Secondary | ICD-10-CM | POA: Diagnosis not present

## 2021-02-07 DIAGNOSIS — E11319 Type 2 diabetes mellitus with unspecified diabetic retinopathy without macular edema: Secondary | ICD-10-CM | POA: Diagnosis not present

## 2021-02-07 DIAGNOSIS — Z794 Long term (current) use of insulin: Secondary | ICD-10-CM | POA: Diagnosis not present

## 2021-05-14 DIAGNOSIS — E119 Type 2 diabetes mellitus without complications: Secondary | ICD-10-CM | POA: Diagnosis not present

## 2021-06-19 DIAGNOSIS — I10 Essential (primary) hypertension: Secondary | ICD-10-CM | POA: Diagnosis not present

## 2021-06-19 DIAGNOSIS — Z794 Long term (current) use of insulin: Secondary | ICD-10-CM | POA: Diagnosis not present

## 2021-06-19 DIAGNOSIS — E11319 Type 2 diabetes mellitus with unspecified diabetic retinopathy without macular edema: Secondary | ICD-10-CM | POA: Diagnosis not present

## 2021-06-19 DIAGNOSIS — E782 Mixed hyperlipidemia: Secondary | ICD-10-CM | POA: Diagnosis not present

## 2021-09-02 DIAGNOSIS — L814 Other melanin hyperpigmentation: Secondary | ICD-10-CM | POA: Diagnosis not present

## 2021-09-02 DIAGNOSIS — L570 Actinic keratosis: Secondary | ICD-10-CM | POA: Diagnosis not present

## 2021-09-02 DIAGNOSIS — D0462 Carcinoma in situ of skin of left upper limb, including shoulder: Secondary | ICD-10-CM | POA: Diagnosis not present

## 2021-09-02 DIAGNOSIS — D485 Neoplasm of uncertain behavior of skin: Secondary | ICD-10-CM | POA: Diagnosis not present

## 2021-09-02 DIAGNOSIS — L738 Other specified follicular disorders: Secondary | ICD-10-CM | POA: Diagnosis not present

## 2021-09-09 DIAGNOSIS — H40013 Open angle with borderline findings, low risk, bilateral: Secondary | ICD-10-CM | POA: Diagnosis not present

## 2021-09-17 DIAGNOSIS — J45909 Unspecified asthma, uncomplicated: Secondary | ICD-10-CM | POA: Diagnosis not present

## 2021-09-17 DIAGNOSIS — E11319 Type 2 diabetes mellitus with unspecified diabetic retinopathy without macular edema: Secondary | ICD-10-CM | POA: Diagnosis not present

## 2021-09-17 DIAGNOSIS — I10 Essential (primary) hypertension: Secondary | ICD-10-CM | POA: Diagnosis not present

## 2021-09-17 DIAGNOSIS — E782 Mixed hyperlipidemia: Secondary | ICD-10-CM | POA: Diagnosis not present

## 2021-10-02 DIAGNOSIS — Z01419 Encounter for gynecological examination (general) (routine) without abnormal findings: Secondary | ICD-10-CM | POA: Diagnosis not present

## 2021-10-02 DIAGNOSIS — Z6827 Body mass index (BMI) 27.0-27.9, adult: Secondary | ICD-10-CM | POA: Diagnosis not present

## 2021-10-02 DIAGNOSIS — Z1231 Encounter for screening mammogram for malignant neoplasm of breast: Secondary | ICD-10-CM | POA: Diagnosis not present

## 2021-10-02 DIAGNOSIS — Z124 Encounter for screening for malignant neoplasm of cervix: Secondary | ICD-10-CM | POA: Diagnosis not present

## 2021-11-20 ENCOUNTER — Encounter: Payer: Self-pay | Admitting: Nurse Practitioner

## 2021-11-20 DIAGNOSIS — R140 Abdominal distension (gaseous): Secondary | ICD-10-CM | POA: Diagnosis not present

## 2021-11-20 DIAGNOSIS — R195 Other fecal abnormalities: Secondary | ICD-10-CM | POA: Diagnosis not present

## 2021-11-20 DIAGNOSIS — I10 Essential (primary) hypertension: Secondary | ICD-10-CM | POA: Diagnosis not present

## 2021-11-21 DIAGNOSIS — R197 Diarrhea, unspecified: Secondary | ICD-10-CM | POA: Diagnosis not present

## 2021-12-05 ENCOUNTER — Other Ambulatory Visit: Payer: Self-pay

## 2021-12-08 NOTE — Progress Notes (Addendum)
12/09/2021 [place] [id number] 07/26/1956   CHIEF COMPLAINT: Abdominal bloating, change in stool color   HISTORY OF PRESENT ILLNESS: [place] P. Offutt is a 66 year old female with a past medical history of arthritis, asthma, hypertension, hyperlipidemia, DM II, hypothyroidism, s/p gastric bypass surgery 2013 and cholecystectomy in 2014.  She presents to our office today as referred by Dr. Philip Aspen for further evaluation regarding a change in bowel pattern.  She describes passing frequent malodorous yellow mud pie like stools with explosive gas with intermittent upper and lower abdominal pain.  She has blowout episodes of diarrhea described as passing 2 to 3 nonbloody watery stools once every 3 to 4 months with associated abdominal cramps which has occurred since her gallbladder surgery in 2014.  No specific food triggers.  She reported her PCP did stool cultures which were negative.  She has mild nausea at times with dry heaves but does not vomit.  No recent antibiotics.  No rectal bleeding or black stools.  No NSAIDs.  She takes Tylenol only.  She has been on ferrous sulfate since her gastric bypass surgery in 2013.  She denies any recent history of anemia.  No dysphagia or heartburn.  She underwent a colonoscopy by Dr. Scarlette Shorts 02/04/2010 which showed diverticulosis to the sigmoid colon, no polyps.   On exam, she has 1+ pitting edema from the knees to the feet.  No prior history of edema.  She noticed the swelling in her legs for the past 3 days.  She also feels her fingers are swollen and eyes are puffy.  No fever, sweats or chills.  No weight loss.  Laboratory studies 11/21/2021: Stool culture including Salmonella/Shigella, Campylobacter and E.coli toxin: Negative.  O&P negative.  Glucose 206.  Sodium 137.  Potassium 4.2.  BUN 10.  Creatinine 0.8.  Alk phos 95.  AST 25.  ALT 19.  Total bili 0.6.  WBC 6.28.  Hemoglobin 13.3.  Hematocrit 37.3.  Platelet 293.  Past Medical History:   Diagnosis Date   Arthritis    Asthma    Chronic venous insufficiency    vascular-- dr Scot Dock-- bilateral deep vein and superficial reflux   Hyperlipidemia    Hypertension    Hypothyroidism    PONV (postoperative nausea and vomiting)    severe   S/P gastric bypass 08/2012   Type 2 diabetes mellitus treated with insulin ([employer])    Varicose veins of both lower extremities    Past Surgical History:  Procedure Laterality Date   ACHILLES TENDON REPAIR Left mid 2000s   CARPAL TUNNEL RELEASE Bilateral 1988   CATARACT EXTRACTION W/ INTRAOCULAR LENS  IMPLANT, BILATERAL  10/ 2018;  2014 approx.   CHOLECYSTECTOMY     2014   COLONOSCOPY  last one 2010 approx.   GASTRIC BYPASS  08/2012   KNEE ARTHROSCOPY Right early 2000   AND  RIGHT ACHILLES TENDON REPAIR    KNEE CLOSED REDUCTION Right 07/05/2018   Procedure: CLOSED MANIPULATION RIGHT KNEE;  Surgeon: Gaynelle Arabian, MD;  Location: [facility];  Service: Orthopedics;  Laterality: Right;  28mn   ROTATOR CUFF REPAIR Right 2000   TONSILLECTOMY  child   TOTAL KNEE ARTHROPLASTY Right 04/05/2018   Procedure: RIGHT TOTAL KNEE ARTHROPLASTY;  Surgeon: AGaynelle Arabian MD;  Location: [facility];  Service: Orthopedics;  Laterality: Right;   WISDOM TOOTH EXTRACTION     Social History: She is married.  She has 3 daughters.  She is retired.  She is married.  One glass of wine twice weekly. Nonsmoker.   Family History: Mother had lung cancer. Father deceased with history of heart disease, MI and diabetes. Brother x 2 with hypercholesterolemia. Older brother has atrial fibrillation.   No Known Allergies   Outpatient Encounter Medications as of 12/09/2021  Medication Sig   albuterol (VENTOLIN HFA) 108 (90 Base) MCG/ACT inhaler Inhale 2 puffs into the lungs every 6 (six) hours as needed for wheezing or shortness of breath.    Biotin 5000 MCG CAPS Take 5,000 mcg by mouth daily.   budesonide-formoterol (SYMBICORT) 160-4.5 MCG/ACT inhaler Inhale 1 puff into the lungs  every morning.    Calcium Carb-Cholecalciferol 500-400 MG-UNIT TABS Take 1 tablet by mouth at bedtime.   Cholecalciferol (VITAMIN D3) 5000 units CAPS Take 10,000 Units by mouth daily at 2 PM.   Cyanocobalamin (B-12) 2500 MCG TABS Take 2,500 mcg by mouth every Wednesday.   dicyclomine (BENTYL) 10 MG capsule Take 1 capsule (10 mg total) by mouth 2 (two) times daily as needed for spasms (abdominal pain).   ferrous sulfate 325 (65 FE) MG EC tablet Take 325 mg by mouth daily at 2 PM.    gabapentin (NEURONTIN) 300 MG capsule 300 mg as needed.   insulin glargine (LANTUS SOLOSTAR) 100 UNIT/ML Solostar Pen Lantus Solostar U-100 Insulin 100 unit/mL (3 mL) subcutaneous pen  INJECT 16 UNITS UNDER THE SKIN ONCE DAILY   insulin lispro (HUMALOG KWIKPEN) 100 UNIT/ML KwikPen Inject into the skin. Per sliding scale with meals   levothyroxine (SYNTHROID) 100 MCG tablet 100 mcg daily.   Multiple Vitamin (MULTIVITAMIN) tablet Take 4 tablets by mouth daily at 2 PM.    olmesartan (BENICAR) 40 MG tablet 40 mg daily.   ONE TOUCH ULTRA TEST test strip    ONETOUCH DELICA LANCETS FINE MISC    pravastatin (PRAVACHOL) 40 MG tablet Take 40 mg by mouth every morning.    [DISCONTINUED] diclofenac sodium (VOLTAREN) 1 % GEL Apply 2 g topically 2 (two) times daily as needed.   [DISCONTINUED] empagliflozin (JARDIANCE) 25 MG TABS tablet 25 mg daily.   [DISCONTINUED] insulin aspart (NOVOLOG FLEXPEN) 100 UNIT/ML FlexPen Novolog FlexPen U-100 Insulin aspart 100 unit/mL (3 mL) subcutaneous   [DISCONTINUED] levothyroxine (SYNTHROID, LEVOTHROID) 125 MCG tablet Take 125 mcg by mouth daily before breakfast.   [DISCONTINUED] losartan (COZAAR) 50 MG tablet Take 50 mg by mouth daily.    [DISCONTINUED] metroNIDAZOLE (METROGEL) 1 % gel Apply 1 application topically daily.    [DISCONTINUED] omeprazole (PRILOSEC) 20 MG capsule 20 mg daily.   No facility-administered encounter medications on file as of 12/09/2021.    REVIEW OF SYSTEMS:  Gen:  Denies fever, sweats or chills. No weight loss.  CV: Denies chest pain, palpitations or edema. Resp: Denies cough, shortness of breath of hemoptysis.  GI: See HPI  GU : Denies urinary burning, blood in urine, increased urinary frequency or incontinence. MS: Denies joint pain, muscles aches or weakness. Derm: Denies rash, itchiness, skin lesions or unhealing ulcers. Psych: Denies depression, anxiety or memory loss. Heme: Denies bruising, easy bleeding. Neuro:  Denies headaches, dizziness or paresthesias. Endo:  + DM II.  PHYSICAL EXAM: BP 122/70    Pulse (!) 51    Ht 5' 4"  (1.626 m)    Wt 162 lb (73.5 kg)    SpO2 99%    BMI 27.81 kg/m  BP 122/70    Pulse (!) 51    Ht 5' 4"  (1.626 m)    Wt 162 lb (73.5 kg)  SpO2 99%    BMI 27.81 kg/m   General: 66 year old female in no acute distress. Head: Normocephalic and atraumatic. Eyes:  Sclerae non-icteric, conjunctive pink. Ears: Normal auditory acuity. Mouth: Dentition intact. No ulcers or lesions.  Neck: Supple, no lymphadenopathy or thyromegaly.  Lungs: Clear bilaterally to auscultation without wheezes, crackles or rhonchi. Heart: Regular rate and rhythm. No murmur, rub or gallop appreciated.  Abdomen: Soft, non distended. Mild tenderness above the umbilicus and RLQ without rebound or guarding. No masses. No hepatosplenomegaly. Normoactive bowel sounds x 4 quadrants. Laparoscopic scars intact.  Rectal: Deferred. Musculoskeletal: Symmetrical with no gross deformities. Skin: Warm and dry. No rash or lesions on visible extremities. Extremities: bilateral 1+ pitting edema from knees to ankles.  Neurological: Alert oriented x 4, no focal deficits.  Psychological:  Alert and cooperative. Normal mood and affect.  ASSESSMENT AND PLAN:  5) 66 year old female with change in bowel pattern, stools soft to loose and yellow in color.  Patient reported stool cultures by PCP were negative.  Normal CBC and LFTs. -Colonoscopy to be scheduled after CTAP  and lab results received -Imodium 1 tab p.o. daily as needed, stop if no bowel movement 24 hours  2) Intermittent upper and lower abdominal pain for the past 4 weeks.  -CBC, CMP, lipase and CRP -Consider EGD at time of colonoscopy if upper abdominal pain persists  3) Colon cancer screening.  Colonoscopy 02/04/2010 showed diverticulosis, no polyps. -Colonoscopy to be scheduled after CTAP and lab results received  3) Lower extremity edema x 3 days -Patient to follow-up with PCP for further evaluation/management  Further recommendations to be determined after CTAP results reviewed.  ADDENDUM: CTAP with contrast was scheduled 12/23/2021, however, Dr. Leanna Battles requested a stat CTAP which is scheduled 12/13/2021 therefore the CT scheduled 12/23/2021 was canceled.   CC:  Donnajean Lopes, MD

## 2021-12-09 ENCOUNTER — Ambulatory Visit: Payer: Medicare Other | Admitting: Nurse Practitioner

## 2021-12-09 ENCOUNTER — Other Ambulatory Visit (INDEPENDENT_AMBULATORY_CARE_PROVIDER_SITE_OTHER): Payer: Medicare Other

## 2021-12-09 ENCOUNTER — Encounter: Payer: Self-pay | Admitting: Nurse Practitioner

## 2021-12-09 VITALS — BP 122/70 | HR 51 | Ht 64.0 in | Wt 162.0 lb

## 2021-12-09 DIAGNOSIS — R1310 Dysphagia, unspecified: Secondary | ICD-10-CM

## 2021-12-09 DIAGNOSIS — R110 Nausea: Secondary | ICD-10-CM | POA: Diagnosis not present

## 2021-12-09 DIAGNOSIS — R1030 Lower abdominal pain, unspecified: Secondary | ICD-10-CM

## 2021-12-09 DIAGNOSIS — R1013 Epigastric pain: Secondary | ICD-10-CM | POA: Diagnosis not present

## 2021-12-09 LAB — CBC WITH DIFFERENTIAL/PLATELET
Basophils Absolute: 0 10*3/uL (ref 0.0–0.1)
Basophils Relative: 0.8 % (ref 0.0–3.0)
Eosinophils Absolute: 0.1 10*3/uL (ref 0.0–0.7)
Eosinophils Relative: 2.6 % (ref 0.0–5.0)
HCT: 35.4 % — ABNORMAL LOW (ref 36.0–46.0)
Hemoglobin: 12.5 g/dL (ref 12.0–15.0)
Lymphocytes Relative: 23.6 % (ref 12.0–46.0)
Lymphs Abs: 1.1 10*3/uL (ref 0.7–4.0)
MCHC: 35.2 g/dL (ref 30.0–36.0)
MCV: 94 fl (ref 78.0–100.0)
Monocytes Absolute: 0.4 10*3/uL (ref 0.1–1.0)
Monocytes Relative: 9.3 % (ref 3.0–12.0)
Neutro Abs: 2.8 10*3/uL (ref 1.4–7.7)
Neutrophils Relative %: 63.7 % (ref 43.0–77.0)
Platelets: 237 10*3/uL (ref 150.0–400.0)
RBC: 3.77 Mil/uL — ABNORMAL LOW (ref 3.87–5.11)
RDW: 13.3 % (ref 11.5–15.5)
WBC: 4.5 10*3/uL (ref 4.0–10.5)

## 2021-12-09 LAB — C-REACTIVE PROTEIN: CRP: <1 mg/dL (ref 0.5–20.0)

## 2021-12-09 LAB — LIPASE: Lipase: 15 U/L (ref 11.0–59.0)

## 2021-12-09 MED ORDER — DICYCLOMINE HCL 10 MG PO CAPS: 10.0000 mg | ORAL_CAPSULE | Freq: Two times a day (BID) | ORAL | 1 refills | Status: DC | PRN

## 2021-12-09 NOTE — Patient Instructions (Signed)
LABS:   Please proceed to the basement level for lab work before leaving today. Press "B" on the elevator. The lab is located at the first door on the left as you exit the elevator.  IMAGING: You will be contacted by [facility] Scheduling (Your caller ID will indicate phone # [phone]) in the next 7 days to schedule your CT Scan. If you have not heard from them within 7 business days, please call [facility] Scheduling at [phone] to follow up on the status of your appointment.    MEDICATION: We have sent the following medication to your pharmacy for you to pick up at your convenience: Dicyclomine 10 MG tablet, take 1 tablet twice a day as needed for abdominal pain.  RECOMMENDATIONS:  Follow up with primary care provider regarding the swelling in your legs. You can take Imodium 1 tablet daily as needed.  BMI:  If you are age 55 or older, your body mass index should be between 23-30. Your Body mass index is 27.81 kg/m. If this is out of the aforementioned range listed, please consider follow up with your Primary Care Provider.  If you are age 63 or younger, your body mass index should be between 19-25. Your Body mass index is 27.81 kg/m. If this is out of the aformentioned range listed, please consider follow up with your Primary Care Provider.   MY CHART:  The [place] [facility] providers would like to encourage you to use [facility] to communicate with providers for non-urgent requests or questions.  Due to long hold times on the telephone, sending your provider a message by [facility] may be a faster and more efficient way to get a response.  Please allow 48 business hours for a response.  Please remember that this is for non-urgent requests.   Thank you for trusting me with your gastrointestinal care!    Arnaldo Natal, CRNP

## 2021-12-09 NOTE — Progress Notes (Signed)
RADIOLOGY SCHEDULING REQUEST FOR CT Abd/Pelvis [facility] Scheduling via secure staff message.

## 2021-12-10 DIAGNOSIS — R140 Abdominal distension (gaseous): Secondary | ICD-10-CM | POA: Diagnosis not present

## 2021-12-10 DIAGNOSIS — R601 Generalized edema: Secondary | ICD-10-CM | POA: Diagnosis not present

## 2021-12-10 DIAGNOSIS — R195 Other fecal abnormalities: Secondary | ICD-10-CM | POA: Diagnosis not present

## 2021-12-10 DIAGNOSIS — E11319 Type 2 diabetes mellitus with unspecified diabetic retinopathy without macular edema: Secondary | ICD-10-CM | POA: Diagnosis not present

## 2021-12-10 NOTE — Progress Notes (Signed)
Assessment and plan noted ?

## 2021-12-12 ENCOUNTER — Other Ambulatory Visit: Payer: Self-pay | Admitting: Internal Medicine

## 2021-12-12 ENCOUNTER — Other Ambulatory Visit (HOSPITAL_COMMUNITY): Payer: Self-pay | Admitting: Internal Medicine

## 2021-12-12 ENCOUNTER — Telehealth: Payer: Self-pay | Admitting: Nurse Practitioner

## 2021-12-12 DIAGNOSIS — R1030 Lower abdominal pain, unspecified: Secondary | ICD-10-CM

## 2021-12-12 DIAGNOSIS — R140 Abdominal distension (gaseous): Secondary | ICD-10-CM

## 2021-12-12 NOTE — Telephone Encounter (Signed)
Inbound call from patient's PCP requesting order for CT scan for March to be canceled please.

## 2021-12-12 NOTE — Telephone Encounter (Signed)
CT scheduled for 12/23/21 canceled. PCP has a stat CT for the patient 12/13/21.

## 2021-12-13 ENCOUNTER — Ambulatory Visit (HOSPITAL_BASED_OUTPATIENT_CLINIC_OR_DEPARTMENT_OTHER)
Admission: RE | Admit: 2021-12-13 | Discharge: 2021-12-13 | Disposition: A | Discharge status: Home / Self Care | Payer: Medicare Other | Source: Ambulatory Visit | Attending: Internal Medicine | Admitting: Internal Medicine

## 2021-12-13 ENCOUNTER — Other Ambulatory Visit: Payer: Self-pay

## 2021-12-13 DIAGNOSIS — I700 Atherosclerosis of aorta: Secondary | ICD-10-CM | POA: Diagnosis not present

## 2021-12-13 DIAGNOSIS — R109 Unspecified abdominal pain: Secondary | ICD-10-CM | POA: Diagnosis not present

## 2021-12-13 DIAGNOSIS — R140 Abdominal distension (gaseous): Secondary | ICD-10-CM | POA: Insufficient documentation

## 2021-12-13 DIAGNOSIS — R1030 Lower abdominal pain, unspecified: Secondary | ICD-10-CM | POA: Insufficient documentation

## 2021-12-13 DIAGNOSIS — N1330 Unspecified hydronephrosis: Secondary | ICD-10-CM | POA: Diagnosis not present

## 2021-12-13 LAB — POCT I-STAT CREATININE: Creatinine, Ser: 0.8 mg/dL (ref 0.44–1.00)

## 2021-12-13 MED ORDER — IOHEXOL 300 MG/ML  SOLN
100.0000 mL | Freq: Once | INTRAMUSCULAR | Status: AC | PRN
  Administered 2021-12-13: 100 mL via INTRAVENOUS

## 2021-12-20 NOTE — Telephone Encounter (Signed)
Beth, pls schedule the patient for an EGD and colonoscopy with Dr. Marina Goodell. She needs to review the CTAP findings regarding left hydronephrosis with her PCP. Send in RX for Dicyclomine 10mg  one po bid for her abdominal pain. Patient to contact me next week if no improvement. THX ?

## 2021-12-20 NOTE — Telephone Encounter (Signed)
Rebecca Camacho, addendum to my note below. She has RX for Dicyclomine, she can take it qid if needed as long as she does not develop dizziness or lightheadedness after taking it.  ?

## 2021-12-23 ENCOUNTER — Ambulatory Visit (HOSPITAL_COMMUNITY): Payer: Medicare Other

## 2021-12-23 ENCOUNTER — Encounter (HOSPITAL_COMMUNITY): Payer: Self-pay

## 2021-12-23 DIAGNOSIS — N130 Hydronephrosis with ureteropelvic junction obstruction: Secondary | ICD-10-CM | POA: Diagnosis not present

## 2021-12-30 DIAGNOSIS — R140 Abdominal distension (gaseous): Secondary | ICD-10-CM | POA: Diagnosis not present

## 2021-12-30 DIAGNOSIS — R635 Abnormal weight gain: Secondary | ICD-10-CM | POA: Diagnosis not present

## 2021-12-30 DIAGNOSIS — I509 Heart failure, unspecified: Secondary | ICD-10-CM | POA: Diagnosis not present

## 2021-12-30 DIAGNOSIS — R601 Generalized edema: Secondary | ICD-10-CM | POA: Diagnosis not present

## 2021-12-31 ENCOUNTER — Other Ambulatory Visit (HOSPITAL_COMMUNITY): Payer: Self-pay | Admitting: Urology

## 2021-12-31 ENCOUNTER — Other Ambulatory Visit: Payer: Self-pay | Admitting: Urology

## 2021-12-31 DIAGNOSIS — N130 Hydronephrosis with ureteropelvic junction obstruction: Secondary | ICD-10-CM

## 2022-01-05 NOTE — Progress Notes (Deleted)
?Cardiology Office Note:   ? ?Date:  01/05/2022  ? ?ID:  Rebecca, Camacho Aug 07, 1956, MRN [id number] ? ?PCP:  Rebecca Fillers, MD ?  ?[facility] Providers ?Cardiologist:  None { ?  ? ?Referring MD: Rebecca Fillers, MD  ? ? ?History of Present Illness:   ? ?Rebecca Camacho is a 66 y.o. female with a hx of DMII, HTN, HLD, obesity s/p gastric bypass surgery, chronic venous insufficiency, and hypothyroidism who was referred by Dr. Eloise Camacho for follow-up of a murmur. ? ?Past Medical History:  ?Diagnosis Date  ? Arthritis   ? Asthma   ? Chronic venous insufficiency   ? vascular-- dr Edilia Bo-- bilateral deep vein and superficial reflux  ? Hyperlipidemia   ? Hypertension   ? Hypothyroidism   ? PONV (postoperative nausea and vomiting)   ? severe  ? 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 Type 2 diabetes mellitus treated with insulin ([facility])   ? Varicose veins of both lower extremities   ? ? ?Past Surgical History:  ?Procedure Laterality Date  ? ACHILLES TENDON REPAIR Left mid 2000s  ? CARPAL TUNNEL RELEASE Bilateral 1988  ? CATARACT EXTRACTION W/ INTRAOCULAR LENS  IMPLANT, BILATERAL  10/ 2018;  2014 approx.  ? CHOLECYSTECTOMY    ? 2014  ? COLONOSCOPY  last one 2010 approx.  ? GASTRIC BYPASS  08/2012  ? KNEE ARTHROSCOPY Right early 2000  ? AND  RIGHT ACHILLES TENDON REPAIR   ? KNEE CLOSED REDUCTION Right 07/05/2018  ? Procedure: CLOSED MANIPULATION RIGHT KNEE;  Surgeon: Ollen Gross, MD;  Location: [facility];  Service: Orthopedics;  Laterality: Right;   ? ROTATOR CUFF REPAIR Right 2000  ? TONSILLECTOMY  child  ? TOTAL KNEE ARTHROPLASTY Right 04/05/2018  ? Procedure: RIGHT TOTAL KNEE ARTHROPLASTY;  Surgeon: Ollen Gross, MD;  Location: [facility];  Service: Orthopedics;  Laterality: Right;  ? WISDOM TOOTH EXTRACTION    ? ? ?Current Medications: ?No outpatient medications have been marked as taking for the 01/07/22 encounter (Appointment) with Meriam Sprague, MD.  ?  ? ?Allergies:   Patient has no known  allergies.  ? ?Social History  ? ?Socioeconomic History  ? Marital status: Married  ?  Spouse name: Not on file  ? Number of children: 3  ? Years of education: Not on file  ? Highest education level: Not on file  ?Occupational History  ? Occupation: retired  ?Tobacco Use  ? Smoking status: Never  ? Smokeless tobacco: Never  ?Vaping Use  ? Vaping Use: Never used  ?Substance and Sexual Activity  ? Alcohol use: Yes  ?  Comment: occasional  ? Drug use: No  ? Sexual activity: Not on file  ?Other Topics Concern  ? Not on file  ?Social History Narrative  ? Not on file  ? ?Social Determinants of Health  ? ?Financial Resource Strain: Not on file  ?Food Insecurity: Not on file  ?Transportation Needs: Not on file  ?Physical Activity: Not on file  ?Stress: Not on file  ?Social Connections: Not on file  ?  ? ?Family History: ?The patient's ***family history includes Cancer in her mother; Diabetes in her father, maternal grandfather, and paternal grandfather; Heart attack in her father; Heart disease in her father; Hyperlipidemia in her brother and father; Hypertension in her father; Other in her father. There is no history of Colon cancer, Rectal cancer, Stomach cancer, Esophageal cancer, Pancreatic cancer, or Lung cancer. ? ?ROS:   ?Please see the history  of present illness.    ?*** All other systems reviewed and are negative. ? ?EKGs/Labs/Other Studies Reviewed:   ? ?The following studies were reviewed today: ?*** ? ?EKG:  EKG is *** ordered today.  The ekg ordered today demonstrates *** ? ?Recent Labs: ?12/09/2021: Hemoglobin 12.5; Platelets 237.0 ?12/13/2021: Creatinine, Ser 0.80  ?Recent Lipid Panel ?No results found for: CHOL, TRIG, HDL, CHOLHDL, VLDL, LDLCALC, LDLDIRECT ? ? ?Risk Assessment/Calculations:   ?{Does this patient have ATRIAL FIBRILLATION?:[phone]} ? ?    ? ?Physical Exam:   ? ?VS:  There were no vitals taken for this visit.   ? ?Wt Readings from Last 3 Encounters:  ?12/09/21 162 lb (73.5 kg)  ?12/08/18 145  lb (65.8 kg)  ?10/28/18 140 lb (63.5 kg)  ?  ? ?GEN: *** Well nourished, well developed in no acute distress ?HEENT: Normal ?NECK: No JVD; No carotid bruits ?LYMPHATICS: No lymphadenopathy ?CARDIAC: ***RRR, no murmurs, rubs, gallops ?RESPIRATORY:  Clear to auscultation without rales, wheezing or rhonchi  ?ABDOMEN: Soft, non-tender, non-distended ?MUSCULOSKELETAL:  No edema; No deformity  ?SKIN: Warm and dry ?NEUROLOGIC:  Alert and oriented x 3 ?PSYCHIATRIC:  Normal affect  ? ?ASSESSMENT:   ? ?No diagnosis found. ?PLAN:   ? ?In order of problems listed above: ? ?#Murmur: ?-Check TTE ? ?#HTN: ?-Continue olmesartan 40mg  daily ? ?#HLD: ?-Continue pravastatin 40mg  daily ? ? ?   ? ?{Are you ordering a CV Procedure (e.g. stress test, cath, DCCV, TEE, etc)?   Press F2        :  ? ? ?Medication Adjustments/Labs and Tests Ordered: ?Current medicines are reviewed at length with the patient today.  Concerns regarding medicines are outlined above.  ?No orders of the defined types were placed in this encounter. ? ?No orders of the defined types were placed in this encounter. ? ? ?There are no Patient Instructions on file for this visit.  ? ?Signed, ? , MD  ?01/05/2022 4:34 PM    ?[facility] ?

## 2022-01-06 NOTE — Progress Notes (Signed)
?Cardiology Office Note:   ? ?Date:  01/07/2022  ? ?ID:  Rebecca Camacho, Stitz 03-01-1956, MRN [id number] ? ?PCP:  Donnajean Lopes, MD ?  ?[facility] Providers ?Cardiologist:  None { ?  ? ?Referring MD: Donnajean Lopes, MD  ? ? ?History of Present Illness:   ? ?Rebecca Camacho is a 66 y.o. female with a hx of DMII, HTN, HLD, obesity s/p gastric bypass surgery, chronic venous insufficiency, and hypothyroidism who was referred by Dr. Philip Aspen for follow-up of a murmur. ? ?Today, the patient is accompanied by her husband. She states that over the past few months, she has been having significant GI distress with frequent episodes of diarrhea. She is planned for EGD/colo in April to work this up further. 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 Planned for nuclear scan tomorrow to work-up further. ? ?Since the onset of her GI issues, she noticed a significant increase in her blood pressures (systolics [id number]) at home as well as increasing LE edema. She states she saw her PCP and was initiated on lasix and amlodipine with improvement. However, BP still remain high in 160-170s at home. She denies any HA, chest pain, SOB or dyspnea. She is very active and is able to swim and walk without issues. Has noticed she has been more fatigued with this recently. ? ?No personal history of CAD. Has family history of heart disease with CAD in her father, brother and nephew with Afib. ? ?She denies chest pain, chest pressure, dyspnea at rest or with exertion, palpitations, PND, or orthopnea. Denies cough, fever, chills. Denies nausea, vomiting. Denies syncope or presyncope. Denies dizziness or lightheadedness. Denies snoring. ? ?Past Medical History:  ?Diagnosis Date  ? Arthritis   ? Asthma   ? Chronic venous insufficiency   ? vascular-- dr Scot Dock-- bilateral deep vein and superficial reflux  ? Hyperlipidemia   ? Hypertension    ? Hypothyroidism   ? PONV (postoperative nausea and vomiting)   ? severe  ? 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 Type 2 diabetes mellitus treated with insulin ([place])   ? Varicose veins of both lower extremities   ? ? ?Past Surgical History:  ?Procedure Laterality Date  ? ACHILLES TENDON REPAIR Left mid 2000s  ? CARPAL TUNNEL RELEASE Bilateral 1988  ? CATARACT EXTRACTION W/ INTRAOCULAR LENS  IMPLANT, BILATERAL  10/ 2018;  2014 approx.  ? CHOLECYSTECTOMY    ? 2014  ? COLONOSCOPY  last one 2010 approx.  ? GASTRIC BYPASS  08/2012  ? KNEE ARTHROSCOPY Right early 2000  ? AND  RIGHT ACHILLES TENDON REPAIR   ? KNEE CLOSED REDUCTION Right 07/05/2018  ? Procedure: CLOSED MANIPULATION RIGHT KNEE;  Surgeon: Gaynelle Arabian, MD;  Location: [facility];  Service: Orthopedics;  Laterality: Right;  59min  ? ROTATOR CUFF REPAIR Right 2000  ? TONSILLECTOMY  child  ? TOTAL KNEE ARTHROPLASTY Right 04/05/2018  ? Procedure: RIGHT TOTAL KNEE ARTHROPLASTY;  Surgeon: Gaynelle Arabian, MD;  Location: [facility];  Service: Orthopedics;  Laterality: Right;  ? WISDOM TOOTH EXTRACTION    ? ? ?Current Medications: ?Current Meds  ?Medication Sig  ? albuterol (VENTOLIN HFA) 108 (90 Base) MCG/ACT inhaler Inhale 2 puffs into the lungs every 6 (six) hours as needed for wheezing or shortness of breath.   ? amLODipine (NORVASC) 10 MG tablet Take 1 tablet (10 mg total) by mouth daily.  ? Biotin  5000 MCG CAPS Take 5,000 mcg by mouth daily.  ? budesonide-formoterol (SYMBICORT) 160-4.5 MCG/ACT inhaler Inhale 1 puff into the lungs every morning.   ? Calcium Carb-Cholecalciferol 500-400 MG-UNIT TABS Take 1 tablet by mouth at bedtime.  ? Cholecalciferol (VITAMIN D3) 5000 units CAPS Take 10,000 Units by mouth daily at 2 PM.  ? Cyanocobalamin (B-12) 2500 MCG TABS Take 2,500 mcg by mouth every Wednesday.  ? dicyclomine (BENTYL) 10 MG capsule Take 1 capsule (10 mg total) by mouth 2 (two) times daily as needed for spasms (abdominal pain).  ? ferrous sulfate 325 (65 FE) MG EC tablet  Take 325 mg by mouth daily at 2 PM.   ? furosemide (LASIX) 20 MG tablet Take 1 tablet (20 mg total) by mouth twice daily for 2 days only, then decrease to taking 1 tablet (20 mg total) by mouth daily thereafter.  ? gabapentin (NEURONTIN) 300 MG capsule 300 mg as needed.  ? insulin glargine (LANTUS SOLOSTAR) 100 UNIT/ML Solostar Pen Lantus Solostar U-100 Insulin 100 unit/mL (3 mL) subcutaneous pen ? INJECT 16 UNITS UNDER THE SKIN ONCE DAILY  ? insulin lispro (HUMALOG) 100 UNIT/ML KwikPen Inject into the skin. Per sliding scale with meals  ? levothyroxine (SYNTHROID) 100 MCG tablet 100 mcg daily.  ? Multiple Vitamin (MULTIVITAMIN) tablet Take 4 tablets by mouth daily at 2 PM.   ? olmesartan (BENICAR) 40 MG tablet 40 mg daily.  ? ONE TOUCH ULTRA TEST test strip   ? ONETOUCH DELICA LANCETS FINE MISC   ? pravastatin (PRAVACHOL) 40 MG tablet Take 40 mg by mouth every morning.   ? spironolactone (ALDACTONE) 25 MG tablet Take 1 tablet (25 mg total) by mouth daily.  ? [DISCONTINUED] amLODipine (NORVASC) 5 MG tablet Take 5 mg by mouth daily.  ? [DISCONTINUED] furosemide (LASIX) 20 MG tablet Take 20 mg by mouth daily.  ?  ? ?Allergies:   Patient has no known allergies.  ? ?Social History  ? ?Socioeconomic History  ? Marital status: Married  ?  Spouse name: Not on file  ? Number of children: 3  ? Years of education: Not on file  ? Highest education level: Not on file  ?Occupational History  ? Occupation: retired  ?Tobacco Use  ? Smoking status: Never  ? Smokeless tobacco: Never  ?Vaping Use  ? Vaping Use: Never used  ?Substance and Sexual Activity  ? Alcohol use: Yes  ?  Comment: occasional  ? Drug use: No  ? Sexual activity: Not on file  ?Other Topics Concern  ? Not on file  ?Social History Narrative  ? Not on file  ? ?Social Determinants of Health  ? ?Financial Resource Strain: Not on file  ?Food Insecurity: Not on file  ?Transportation Needs: Not on file  ?Physical Activity: Not on file  ?Stress: Not on file  ?Social  Connections: Not on file  ?  ? ?Family History: ?The patient's family history includes Cancer in her mother; Diabetes in her father, maternal grandfather, and paternal grandfather; Heart attack in her father; Heart disease in her father; Hyperlipidemia in her brother and father; Hypertension in her father; Other in her father. There is no history of Colon cancer, Rectal cancer, Stomach cancer, Esophageal cancer, Pancreatic cancer, or Lung cancer. ? ?ROS:   ?Please see the history of present illness.    ?Review of Systems  ?Constitutional:  Positive for malaise/fatigue. Negative for fever.  ?HENT:  Negative for congestion and sore throat.   ?Eyes:  Negative for blurred  vision.  ?Respiratory:  Negative for cough and shortness of breath.   ?Cardiovascular:  Positive for leg swelling (Bilateral LE). Negative for chest pain, palpitations, orthopnea, claudication and PND.  ?Gastrointestinal:  Negative for heartburn and nausea.  ?Genitourinary:  Positive for dysuria and frequency.  ?Musculoskeletal:  Negative for joint pain and myalgias.  ?Skin:  Negative for itching and rash.  ?Neurological:  Negative for dizziness and headaches.  ?Endo/Heme/Allergies:  Does not bruise/bleed easily.  ?Psychiatric/Behavioral:  The patient is not nervous/anxious and does not have insomnia.   ?All other systems reviewed and are negative. ? ?EKGs/Labs/Other Studies Reviewed:   ? ?The following studies were reviewed today: ?CT Abdomen 12/13/21: ?IMPRESSION: ?1. No acute findings in the abdomen/pelvis. ?2. Moderate prominence of the left renal pelvis and mild associated left hydronephrosis. Findings may be due to acute mild degree of obstruction versus chronic UPJ obstruction. No renal/ureteral stones visualized. ?3. Postsurgical changes over the gastroesophageal junction and stomach. ?4. Aortic atherosclerosis. ?  ?Aortic Atherosclerosis (ICD10-I70.0). ? ?EKG:   ?01/07/22: Sinus bradycardia, rate 56 bpm; LVH ? ?Recent Labs: ?12/09/2021:  Hemoglobin 12.5; Platelets 237.0 ?12/13/2021: Creatinine, Ser 0.80  ?Recent Lipid Panel ?No results found for: CHOL, TRIG, HDL, CHOLHDL, VLDL, LDLCALC, LDLDIRECT ? ? ?Risk Assessment/Calculations:   ?  ? ?    ? ?Physic

## 2022-01-07 ENCOUNTER — Encounter: Payer: Self-pay | Admitting: Cardiology

## 2022-01-07 ENCOUNTER — Ambulatory Visit: Payer: Medicare Other | Admitting: Cardiology

## 2022-01-07 ENCOUNTER — Other Ambulatory Visit: Payer: Self-pay

## 2022-01-07 VITALS — BP 176/88 | HR 56 | Ht 64.0 in | Wt 155.2 lb

## 2022-01-07 DIAGNOSIS — R011 Cardiac murmur, unspecified: Secondary | ICD-10-CM

## 2022-01-07 DIAGNOSIS — R600 Localized edema: Secondary | ICD-10-CM | POA: Diagnosis not present

## 2022-01-07 DIAGNOSIS — I10 Essential (primary) hypertension: Secondary | ICD-10-CM

## 2022-01-07 DIAGNOSIS — Z79899 Other long term (current) drug therapy: Secondary | ICD-10-CM | POA: Diagnosis not present

## 2022-01-07 DIAGNOSIS — E119 Type 2 diabetes mellitus without complications: Secondary | ICD-10-CM

## 2022-01-07 MED ORDER — FUROSEMIDE 20 MG PO TABS: ORAL_TABLET | ORAL | 0 refills | Status: DC

## 2022-01-07 MED ORDER — AMLODIPINE BESYLATE 10 MG PO TABS: 10.0000 mg | ORAL_TABLET | Freq: Every day | ORAL | 1 refills | Status: DC

## 2022-01-07 MED ORDER — SPIRONOLACTONE 25 MG PO TABS: 25.0000 mg | ORAL_TABLET | Freq: Every day | ORAL | 1 refills | Status: DC

## 2022-01-07 NOTE — Patient Instructions (Addendum)
Medication Instructions:  ? ?START TAKING SPIRONOLACTONE 25 MG BY MOUTH DAILY ? ?INCREASE YOUR LASIX TO TAKING 20 MG BY MOUTH TWICE DAILY FOR 2 DAYS ONLY, THEN DECREASE TO TAKING 20 MG BY MOUTH DAILY THEREAFTER. ? ?INCREASE YOUR AMLODIPINE TO 10 MG BY MOUTH DAILY ? ?*If you need a refill on your cardiac medications before your next appointment, please call your pharmacy* ? ? ?Lab Work: ? ?IN ONE WEEK--BMET AND PRO-BNP ? ?If you have labs (blood work) drawn today and your tests are completely normal, you will receive your results only by: ?MyChart Message (if you have MyChart) OR ?A paper copy in the mail ?If you have any lab test that is abnormal or we need to change your treatment, we will call you to review the results. ? ? ?Testing/Procedures: ? ?Your physician has requested that you have an echocardiogram. Echocardiography is a painless test that uses sound waves to create images of your heart. It provides your doctor with information about the size and shape of your heart and how well your heart?s chambers and valves are working. This procedure takes approximately one hour. There are no restrictions for this procedure. ? ? ? ?Follow-Up: ? ?6 WEEKS HERE IN THE OFFICE WITH DR. Shari Prows OR AN EXTENDER ? ?:1}  ?Other Instructions ? ?KEEP A LOG OF YOUR BLOOD PRESSURES DAILY AND BRING THIS WITH YOU TO YOUR LAB APPOINTMENT IN ONE WEEK TO DROP OFF, OR SEND THEM TO DR. Shari Prows VIA MYCHART ? ?

## 2022-01-08 ENCOUNTER — Ambulatory Visit (HOSPITAL_COMMUNITY)
Admission: RE | Admit: 2022-01-08 | Discharge: 2022-01-08 | Disposition: A | Discharge status: Home / Self Care | Payer: Medicare Other | Source: Ambulatory Visit | Attending: Urology | Admitting: Urology

## 2022-01-08 DIAGNOSIS — N130 Hydronephrosis with ureteropelvic junction obstruction: Secondary | ICD-10-CM | POA: Insufficient documentation

## 2022-01-08 DIAGNOSIS — N1330 Unspecified hydronephrosis: Secondary | ICD-10-CM | POA: Diagnosis not present

## 2022-01-08 MED ORDER — TECHNETIUM TC 99M MERTIATIDE
5.1000 | Freq: Once | INTRAVENOUS | Status: AC
  Administered 2022-01-08: 5.1 via INTRAVENOUS

## 2022-01-08 MED ORDER — FUROSEMIDE 10 MG/ML IJ SOLN
35.0000 mg | Freq: Once | INTRAMUSCULAR | Status: AC
  Administered 2022-01-08: 35 mg via INTRAVENOUS

## 2022-01-08 MED ORDER — FUROSEMIDE 10 MG/ML IJ SOLN
INTRAMUSCULAR | Status: AC
  Filled 2022-01-08: qty 4

## 2022-01-15 ENCOUNTER — Ambulatory Visit (HOSPITAL_COMMUNITY): Payer: Medicare Other | Attending: Cardiology

## 2022-01-15 ENCOUNTER — Other Ambulatory Visit: Payer: Medicare Other | Admitting: *Deleted

## 2022-01-15 ENCOUNTER — Other Ambulatory Visit: Payer: Self-pay

## 2022-01-15 ENCOUNTER — Telehealth: Payer: Self-pay | Admitting: Cardiology

## 2022-01-15 DIAGNOSIS — R011 Cardiac murmur, unspecified: Secondary | ICD-10-CM

## 2022-01-15 DIAGNOSIS — R600 Localized edema: Secondary | ICD-10-CM

## 2022-01-15 DIAGNOSIS — I10 Essential (primary) hypertension: Secondary | ICD-10-CM | POA: Diagnosis not present

## 2022-01-15 DIAGNOSIS — Z79899 Other long term (current) drug therapy: Secondary | ICD-10-CM | POA: Diagnosis not present

## 2022-01-15 DIAGNOSIS — E119 Type 2 diabetes mellitus without complications: Secondary | ICD-10-CM

## 2022-01-15 LAB — ECHOCARDIOGRAM COMPLETE
AR max vel: 1.25 cm2
AV Area VTI: 1.32 cm2
AV Area mean vel: 1.26 cm2
AV Mean grad: 6 mmHg
AV Peak grad: 11.3 mmHg
Ao pk vel: 1.68 m/s
Area-P 1/2: 2.91 cm2
S' Lateral: 2.5 cm

## 2022-01-15 NOTE — Telephone Encounter (Signed)
-----   Message from Meriam Sprague, MD sent at 01/15/2022 12:20 PM EDT ----- ?Echo looks great with normal pumping function and only mild leakiness of the mitral valve which we will monitor with serial echoes every 3-5 years.  ?

## 2022-01-15 NOTE — Telephone Encounter (Signed)
° °  Pt is returning call to get echo result °

## 2022-01-15 NOTE — Telephone Encounter (Signed)
The patient has been notified of the result and verbalized understanding.  All questions (if any) were answered. Nuala Alpha, LPN [id number] PM  ? ?

## 2022-01-16 ENCOUNTER — Telehealth: Payer: Self-pay | Admitting: *Deleted

## 2022-01-16 ENCOUNTER — Other Ambulatory Visit: Payer: Self-pay

## 2022-01-16 DIAGNOSIS — Z79899 Other long term (current) drug therapy: Secondary | ICD-10-CM

## 2022-01-16 DIAGNOSIS — R600 Localized edema: Secondary | ICD-10-CM

## 2022-01-16 DIAGNOSIS — R011 Cardiac murmur, unspecified: Secondary | ICD-10-CM

## 2022-01-16 LAB — BASIC METABOLIC PANEL
BUN/Creatinine Ratio: 19 (ref 12–28)
BUN: 13 mg/dL (ref 8–27)
CO2: 29 mmol/L (ref 20–29)
Calcium: 9.4 mg/dL (ref 8.7–10.3)
Chloride: 103 mmol/L (ref 96–106)
Creatinine, Ser: 0.67 mg/dL (ref 0.57–1.00)
Glucose: 91 mg/dL (ref 70–99)
Potassium: 3.5 mmol/L (ref 3.5–5.2)
Sodium: 146 mmol/L — ABNORMAL HIGH (ref 134–144)
eGFR: 96 mL/min/{1.73_m2} (ref >59)

## 2022-01-16 LAB — PRO B NATRIURETIC PEPTIDE: NT-Pro BNP: 258 pg/mL (ref 0–301)

## 2022-01-16 MED ORDER — FUROSEMIDE 20 MG PO TABS: 20.0000 mg | ORAL_TABLET | Freq: Every day | ORAL | 3 refills | Status: DC

## 2022-01-16 MED ORDER — POTASSIUM CHLORIDE CRYS ER 20 MEQ PO TBCR: 20.0000 meq | EXTENDED_RELEASE_TABLET | Freq: Every day | ORAL | 3 refills | Status: DC

## 2022-01-16 NOTE — Telephone Encounter (Signed)
The patient has been notified of the result and verbalized understanding.  All questions (if any) were answered. ?Sampson Goon, RN 01/16/2022 1:32 PM  ?  ?The swelling is much better. She has been out of town so has not checked her BP much, last one she took was 3/24 ~150/80's.  ?  ?Sent in potassium and refill of lasix to mail service at patients request.  ? ? ?

## 2022-01-16 NOTE — Telephone Encounter (Signed)
-----   Message from Meriam Sprague, MD sent at 01/16/2022  1:18 PM EDT ----- ?Her kidney function looks good. Her potassium is a little on the lower side. Can we start her on potassium daily to take wither her lasix? How is her swelling? How are her blood pressures doing? ?

## 2022-01-20 ENCOUNTER — Ambulatory Visit (AMBULATORY_SURGERY_CENTER): Payer: Medicare Other | Admitting: *Deleted

## 2022-01-20 VITALS — Ht 64.0 in | Wt 155.0 lb

## 2022-01-20 DIAGNOSIS — R1030 Lower abdominal pain, unspecified: Secondary | ICD-10-CM

## 2022-01-20 DIAGNOSIS — R1310 Dysphagia, unspecified: Secondary | ICD-10-CM

## 2022-01-20 DIAGNOSIS — R1013 Epigastric pain: Secondary | ICD-10-CM

## 2022-01-20 MED ORDER — PLENVU 140 G PO SOLR: 1.0000 | Freq: Once | ORAL | 0 refills | Status: AC

## 2022-01-20 NOTE — Progress Notes (Signed)
Patient's pre-visit was done today over the phone with the patient. Name,DOB and address verified. Patient denies any allergies to Eggs and Soy. Patient denies any problems with anesthesia/sedation. Patient is not taking any diet pills or blood thinners. No home Oxygen. Insurance confirmed with patient. ? ?Prep instructions sent to pt's MyChart (if available)-pt is aware. Patient understands to call us back with any questions or concerns. Patient is aware of our care-partner policy and Covid-19 safety protocol.  ? ?EMMI education assigned to the patient for the procedure, sent to MyChart.  ? ?The patient is COVID-19 vaccinated.   ?

## 2022-01-23 ENCOUNTER — Encounter: Payer: Self-pay | Admitting: Internal Medicine

## 2022-02-04 ENCOUNTER — Encounter: Payer: Self-pay | Admitting: Internal Medicine

## 2022-02-04 ENCOUNTER — Ambulatory Visit (AMBULATORY_SURGERY_CENTER): Payer: Medicare Other | Admitting: Internal Medicine

## 2022-02-04 VITALS — BP 165/70 | HR 48 | Temp 97.3°F | Resp 10 | Ht 64.0 in | Wt 155.0 lb

## 2022-02-04 DIAGNOSIS — R1030 Lower abdominal pain, unspecified: Secondary | ICD-10-CM

## 2022-02-04 DIAGNOSIS — R1310 Dysphagia, unspecified: Secondary | ICD-10-CM | POA: Diagnosis not present

## 2022-02-04 DIAGNOSIS — R197 Diarrhea, unspecified: Secondary | ICD-10-CM

## 2022-02-04 DIAGNOSIS — R1013 Epigastric pain: Secondary | ICD-10-CM | POA: Diagnosis not present

## 2022-02-04 DIAGNOSIS — R194 Change in bowel habit: Secondary | ICD-10-CM

## 2022-02-04 MED ORDER — SODIUM CHLORIDE 0.9 % IV SOLN: 500.0000 mL | Freq: Once | INTRAVENOUS | Status: DC

## 2022-02-04 NOTE — Progress Notes (Signed)
Called to room to assist during endoscopic procedure.  Patient ID and intended procedure confirmed with present staff. Received instructions for my participation in the procedure from the performing physician.  

## 2022-02-04 NOTE — Op Note (Signed)
[place] ?Patient Name: Rebecca Camacho ?Procedure Date: 02/04/2022 3:25 PM ?MRN: [id number] ?Endoscopist: Docia Chuck. Henrene Pastor , MD ?Age: 66 ?Referring MD:  ?Date of Birth: [birth year] ?Gender: Female ?Account #: [id number] ?Procedure:                Upper GI endoscopy ?Indications:              Epigastric abdominal pain ?Medicines:                Monitored Anesthesia Care ?Procedure:                Pre-Anesthesia Assessment: ?                          - Prior to the procedure, a History and Physical  ?                          was performed, and patient medications and  ?                          allergies were reviewed. The patient's tolerance of  ?                          previous anesthesia was also reviewed. The risks  ?                          and benefits of the procedure and the sedation  ?                          options and risks were discussed with the patient.  ?                          All questions were answered, and informed consent  ?                          was obtained. Prior Anticoagulants: The patient has  ?                          taken no previous anticoagulant or antiplatelet  ?                          agents. ASA Grade Assessment: II - A patient with  ?                          mild systemic disease. After reviewing the risks  ?                          and benefits, the patient was deemed in  ?                          satisfactory condition to undergo the procedure. ?                          After obtaining informed consent, the endoscope was  ?  passed under direct vision. Throughout the  ?                          procedure, the patient's blood pressure, pulse, and  ?                          oxygen saturations were monitored continuously. The  ?                          GIF [id number] was introduced through the  ?                          mouth, and advanced to the second part of duodenum.  ?                          The upper GI endoscopy was  accomplished without  ?                          difficulty. The patient tolerated the procedure  ?                          well. ?Scope In: ?Scope Out: ?Findings:                 The esophagus was normal. ?                          The stomach revealed evidence of prior Roux-en-Y  ?                          gastric bypass surgery. Small gastric pouch. Widely  ?                          patent anastomosis. No ulceration. ?                          The examined jejunum was normal. ?Complications:            No immediate complications. ?Estimated Blood Loss:     Estimated blood loss: none. ?Impression:               1. Status post Roux-en-Y gastric bypass surgery  ?                          without complicating features. ?Recommendation:           - Patient has a contact number available for  ?                          emergencies. The signs and symptoms of potential  ?                          delayed complications were discussed with the  ?                          patient. Return to normal activities tomorrow.  ?  Written discharge instructions were provided to the  ?                          patient. ?                          - Resume previous diet. ?                          - Continue present medications. ?                          - Recommend Citrucel 2 tablespoons daily. This may  ?                          help improve your bowel consistency ?                          - Office follow-up with Dr. Henrene Pastor in about 4 weeks ?Docia Chuck. Henrene Pastor, MD ?02/04/2022 4:31:18 PM ?This report has been signed electronically. ?

## 2022-02-04 NOTE — Progress Notes (Signed)
VS-CW  Pt's states no medical or surgical changes since previsit or office visit.  

## 2022-02-04 NOTE — Progress Notes (Signed)
CHIEF COMPLAINT: Abdominal bloating, change in stool color  ?  ?HISTORY OF PRESENT ILLNESS: Rebecca Camacho is a 66 year old female with a past medical history of arthritis, asthma, hypertension, hyperlipidemia, DM II, hypothyroidism, s/p gastric bypass surgery 2013 and cholecystectomy in 2014.  She presents to our office today as referred by Dr. Philip Aspen for further evaluation regarding a change in bowel pattern.  She describes passing frequent malodorous yellow mud pie like stools with explosive gas with intermittent upper and lower abdominal pain.  She has blowout episodes of diarrhea described as passing 2 to 3 nonbloody watery stools once every 3 to 4 months with associated abdominal cramps which has occurred since her gallbladder surgery in 2014.  No specific food triggers.  She reported her PCP did stool cultures which were negative.  She has mild nausea at times with dry heaves but does not vomit.  No recent antibiotics.  No rectal bleeding or black stools.  No NSAIDs.  She takes Tylenol only.  She has been on ferrous sulfate since her gastric bypass surgery in 2013.  She denies any recent history of anemia.  No dysphagia or heartburn.  She underwent a colonoscopy by Dr. Scarlette Shorts 02/04/2010 which showed diverticulosis to the sigmoid colon, no polyps.  ?  ?On exam, she has 1+ pitting edema from the knees to the feet.  No prior history of edema.  She noticed the swelling in her legs for the past 3 days.  She also feels her fingers are swollen and eyes are puffy.  No fever, sweats or chills.  No weight loss. ?  ?Laboratory studies 11/21/2021: Stool culture including Salmonella/Shigella, Campylobacter and E.coli toxin: Negative.  O&P negative.  Glucose 206.  Sodium 137.  Potassium 4.2.  BUN 10.  Creatinine 0.8.  Alk phos 95.  AST 25.  ALT 19.  Total bili 0.6.  WBC 6.28.  Hemoglobin 13.3.  Hematocrit 37.3.  Platelet 293. ?  ?    ?Past Medical History:  ?Diagnosis Date  ? Arthritis    ? Asthma    ? Chronic  venous insufficiency    ?  vascular-- dr Scot Dock-- bilateral deep vein and superficial reflux  ? Hyperlipidemia    ? Hypertension    ? Hypothyroidism    ? PONV (postoperative nausea and vomiting)    ?  severe  ? 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 Type 2 diabetes mellitus treated with insulin ([facility])    ? Varicose veins of both lower extremities    ?  ?     ?Past Surgical History:  ?Procedure Laterality Date  ? ACHILLES TENDON REPAIR Left mid 2000s  ? CARPAL TUNNEL RELEASE Bilateral 1988  ? CATARACT EXTRACTION W/ INTRAOCULAR LENS  IMPLANT, BILATERAL   10/ 2018;  2014 approx.  ? CHOLECYSTECTOMY      ?  2014  ? COLONOSCOPY   last one 2010 approx.  ? GASTRIC BYPASS   08/2012  ? KNEE ARTHROSCOPY Right early 2000  ?  AND  RIGHT ACHILLES TENDON REPAIR   ? KNEE CLOSED REDUCTION Right 07/05/2018  ?  Procedure: CLOSED MANIPULATION RIGHT KNEE;  Surgeon: Gaynelle Arabian, MD;  Location: [facility];  Service: Orthopedics;  Laterality: Right;  55mn  ? ROTATOR CUFF REPAIR Right 2000  ? TONSILLECTOMY   child  ? TOTAL KNEE ARTHROPLASTY Right 04/05/2018  ?  Procedure: RIGHT TOTAL KNEE ARTHROPLASTY;  Surgeon: AGaynelle Arabian MD;  Location: [facility];  Service: Orthopedics;  Laterality: Right;  ? WISDOM  TOOTH EXTRACTION      ?  ?Social History: She is married.  She has 3 daughters.  She is retired.  She is married. One glass of wine twice weekly. Nonsmoker.  ?  ?Family History: Mother had lung cancer. Father deceased with history of heart disease, MI and diabetes. Brother x 2 with hypercholesterolemia. Older brother has atrial fibrillation.  ?  ?No Known Allergies ?  ?    ?Outpatient Encounter Medications as of 12/09/2021  ?Medication Sig  ? albuterol (VENTOLIN HFA) 108 (90 Base) MCG/ACT inhaler Inhale 2 puffs into the lungs every 6 (six) hours as needed for wheezing or shortness of breath.   ? Biotin 5000 MCG CAPS Take 5,000 mcg by mouth daily.  ? budesonide-formoterol (SYMBICORT) 160-4.5 MCG/ACT inhaler Inhale 1 puff into the lungs every morning.    ? Calcium Carb-Cholecalciferol 500-400 MG-UNIT TABS Take 1 tablet by mouth at bedtime.  ? Cholecalciferol (VITAMIN D3) 5000 units CAPS Take 10,000 Units by mouth daily at 2 PM.  ? Cyanocobalamin (B-12) 2500 MCG TABS Take 2,500 mcg by mouth every Wednesday.  ? dicyclomine (BENTYL) 10 MG capsule Take 1 capsule (10 mg total) by mouth 2 (two) times daily as needed for spasms (abdominal pain).  ? ferrous sulfate 325 (65 FE) MG EC tablet Take 325 mg by mouth daily at 2 PM.   ? gabapentin (NEURONTIN) 300 MG capsule 300 mg as needed.  ? insulin glargine (LANTUS SOLOSTAR) 100 UNIT/ML Solostar Pen Lantus Solostar U-100 Insulin 100 unit/mL (3 mL) subcutaneous pen ? INJECT 16 UNITS UNDER THE SKIN ONCE DAILY  ? insulin lispro (HUMALOG KWIKPEN) 100 UNIT/ML KwikPen Inject into the skin. Per sliding scale with meals  ? levothyroxine (SYNTHROID) 100 MCG tablet 100 mcg daily.  ? Multiple Vitamin (MULTIVITAMIN) tablet Take 4 tablets by mouth daily at 2 PM.   ? olmesartan (BENICAR) 40 MG tablet 40 mg daily.  ? ONE TOUCH ULTRA TEST test strip    ? ONETOUCH DELICA LANCETS FINE MISC    ? pravastatin (PRAVACHOL) 40 MG tablet Take 40 mg by mouth every morning.   ? [DISCONTINUED] diclofenac sodium (VOLTAREN) 1 % GEL Apply 2 g topically 2 (two) times daily as needed.  ? [DISCONTINUED] empagliflozin (JARDIANCE) 25 MG TABS tablet 25 mg daily.  ? [DISCONTINUED] insulin aspart (NOVOLOG FLEXPEN) 100 UNIT/ML FlexPen Novolog FlexPen U-100 Insulin aspart 100 unit/mL (3 mL) subcutaneous  ? [DISCONTINUED] levothyroxine (SYNTHROID, LEVOTHROID) 125 MCG tablet Take 125 mcg by mouth daily before breakfast.  ? [DISCONTINUED] losartan (COZAAR) 50 MG tablet Take 50 mg by mouth daily.   ? [DISCONTINUED] metroNIDAZOLE (METROGEL) 1 % gel Apply 1 application topically daily.   ? [DISCONTINUED] omeprazole (PRILOSEC) 20 MG capsule 20 mg daily.  ?  ?No facility-administered encounter medications on file as of 12/09/2021.  ?  ?  ?REVIEW OF SYSTEMS:  ?Gen: Denies  fever, sweats or chills. No weight loss.  ?CV: Denies chest pain, palpitations or edema. ?Resp: Denies cough, shortness of breath of hemoptysis.  ?GI: See HPI  ?GU : Denies urinary burning, blood in urine, increased urinary frequency or incontinence. ?MS: Denies joint pain, muscles aches or weakness. ?Derm: Denies rash, itchiness, skin lesions or unhealing ulcers. ?Psych: Denies depression, anxiety or memory loss. ?Heme: Denies bruising, easy bleeding. ?Neuro:  Denies headaches, dizziness or paresthesias. ?Endo:  + DM II. ?  ?PHYSICAL EXAM: ?BP 122/70   Pulse (!) 51   Ht 5' 4"  (1.626 m)   Wt 162 lb (73.5 kg)  SpO2 99%   BMI 27.81 kg/m?  ?BP 122/70   Pulse (!) 51   Ht 5' 4"  (1.626 m)   Wt 162 lb (73.5 kg)   SpO2 99%   BMI 27.81 kg/m?  ?  ?General: 66 year old female in no acute distress. ?Head: Normocephalic and atraumatic. ?Eyes:  Sclerae non-icteric, conjunctive pink. ?Ears: Normal auditory acuity. ?Mouth: Dentition intact. No ulcers or lesions.  ?Neck: Supple, no lymphadenopathy or thyromegaly.  ?Lungs: Clear bilaterally to auscultation without wheezes, crackles or rhonchi. ?Heart: Regular rate and rhythm. No murmur, rub or gallop appreciated.  ?Abdomen: Soft, non distended. Mild tenderness above the umbilicus and RLQ without rebound or guarding. No masses. No hepatosplenomegaly. Normoactive bowel sounds x 4 quadrants. Laparoscopic scars intact.  ?Rectal: Deferred. ?Musculoskeletal: Symmetrical with no gross deformities. ?Skin: Warm and dry. No rash or lesions on visible extremities. ?Extremities: bilateral 1+ pitting edema from knees to ankles.  ?Neurological: Alert oriented x 4, no focal deficits.  ?Psychological:  Alert and cooperative. Normal mood and affect. ?  ?ASSESSMENT AND PLAN: ?  ?62) 66 year old female with change in bowel pattern, stools soft to loose and yellow in color.  Patient reported stool cultures by PCP were negative.  Normal CBC and LFTs. ?-Colonoscopy to be scheduled after CTAP and  lab results received ?-Imodium 1 tab p.o. daily as needed, stop if no bowel movement 24 hours ?  ?2) Intermittent upper and lower abdominal pain for the past 4 weeks.  ?-CBC, CMP, lipase and CRP ?-Consider EGD at time

## 2022-02-04 NOTE — Patient Instructions (Signed)
Resume previous diet and medications. Recommend taking Citrucel 2 TBSP daily to improve your bowel consistency. Office follow up with Dr. Marina Goodell in about 4 weeks. ?Awaiting pathology results. Repeat Colonoscopy in 10 years for surveillance. ? ?YOU HAD AN ENDOSCOPIC PROCEDURE TODAY AT THE [facility]:   Refer to the procedure report that was given to you for any specific questions about what was found during the examination.  If the procedure report does not answer your questions, please call your gastroenterologist to clarify.  If you requested that your care partner not be given the details of your procedure findings, then the procedure report has been included in a sealed envelope for you to review at your convenience later. ? ?YOU SHOULD EXPECT: Some feelings of bloating in the abdomen. Passage of more gas than usual.  Walking can help get rid of the air that was put into your GI tract during the procedure and reduce the bloating. If you had a lower endoscopy (such as a colonoscopy or flexible sigmoidoscopy) you may notice spotting of blood in your stool or on the toilet paper. If you underwent a bowel prep for your procedure, you may not have a normal bowel movement for a few days. ? ?Please Note:  You might notice some irritation and congestion in your nose or some drainage.  This is from the oxygen used during your procedure.  There is no need for concern and it should clear up in a day or so. ? ?SYMPTOMS TO REPORT IMMEDIATELY: ? ?Following lower endoscopy (colonoscopy or flexible sigmoidoscopy): ? Excessive amounts of blood in the stool ? Significant tenderness or worsening of abdominal pains ? Swelling of the abdomen that is new, acute ? Fever of 100?F or higher ? ?Following upper endoscopy (EGD) ? Vomiting of blood or coffee ground material ? New chest pain or pain under the shoulder blades ? Painful or persistently difficult swallowing ? New shortness of breath ? Fever of 100?F or  higher ? Black, tarry-looking stools ? ?For urgent or emergent issues, a gastroenterologist can be reached at any hour by calling [phone]. ?Do not use MyChart messaging for urgent concerns.  ? ? ?DIET:  We do recommend a small meal at first, but then you may proceed to your regular diet.  Drink plenty of fluids but you should avoid alcoholic beverages for 24 hours. ? ?ACTIVITY:  You should plan to take it easy for the rest of today and you should NOT DRIVE or use heavy machinery until tomorrow (because of the sedation medicines used during the test).   ? ?FOLLOW UP: ?Our staff will call the number listed on your records 48-72 hours following your procedure to check on you and address any questions or concerns that you may have regarding the information given to you following your procedure. If we do not reach you, we will leave a message.  We will attempt to reach you two times.  During this call, we will ask if you have developed any symptoms of COVID 19. If you develop any symptoms (ie: fever, flu-like symptoms, shortness of breath, cough etc.) before then, please call [phone].  If you test positive for Covid 19 in the 2 weeks post procedure, please call and report this information to [place].   ? ?If any biopsies were taken you will be contacted by phone or by letter within the next 1-3 weeks.  Please call us at [phone] if you have not heard about the biopsies in 3  weeks.  ? ? ?SIGNATURES/CONFIDENTIALITY: ?You and/or your care partner have signed paperwork which will be entered into your electronic medical record.  These signatures attest to the fact that that the information above on your After Visit Summary has been reviewed and is understood.  Full responsibility of the confidentiality of this discharge information lies with you and/or your care-partner.  ?

## 2022-02-04 NOTE — Op Note (Signed)
[facility] ?Patient Name: Rebecca Camacho ?Procedure Date: 02/04/2022 3:25 PM ?MRN: [id number] ?Endoscopist: Docia Chuck. Henrene Pastor , MD ?Age: 66 ?Referring MD:  ?Date of Birth: 05/01/1956 ?Gender: Female ?Account #: [id number] ?Procedure:                Colonoscopy with biopsies ?Indications:              Change in bowel habits, Diarrhea ?Medicines:                Monitored Anesthesia Care ?Procedure:                Pre-Anesthesia Assessment: ?                          - Prior to the procedure, a History and Physical  ?                          was performed, and patient medications and  ?                          allergies were reviewed. The patient's tolerance of  ?                          previous anesthesia was also reviewed. The risks  ?                          and benefits of the procedure and the sedation  ?                          options and risks were discussed with the patient.  ?                          All questions were answered, and informed consent  ?                          was obtained. Prior Anticoagulants: The patient has  ?                          taken no previous anticoagulant or antiplatelet  ?                          agents. ASA Grade Assessment: II - A patient with  ?                          mild systemic disease. After reviewing the risks  ?                          and benefits, the patient was deemed in  ?                          satisfactory condition to undergo the procedure. ?                          After obtaining informed consent, the colonoscope  ?  was passed under direct vision. Throughout the  ?                          procedure, the patient's blood pressure, pulse, and  ?                          oxygen saturations were monitored continuously. The  ?                          Olympus CF-[id number] ([id number]) Colonoscope was  ?                          introduced through the anus and advanced to the the  ?                          cecum,  identified by appendiceal orifice and  ?                          ileocecal valve. The ileocecal valve, appendiceal  ?                          orifice, and rectum were photographed. The quality  ?                          of the bowel preparation was excellent. The  ?                          colonoscopy was performed without difficulty. The  ?                          patient tolerated the procedure well. The bowel  ?                          preparation used was SUPREP via split dose  ?                          instruction. ?Scope In: 3:44:35 PM ?Scope Out: 4:05:20 PM ?Scope Withdrawal Time: 0 hours 14 minutes 54 seconds  ?Total Procedure Duration: 0 hours 20 minutes 45 seconds  ?Findings:                 The entire examined colon appeared normal on direct  ?                          and retroflexion views. Biopsies for histology were  ?                          taken with a cold forceps from the entire colon for  ?                          evaluation of microscopic colitis. Incidental  ?                          hypertrophic anal papilla. ?Complications:  No immediate complications. Estimated blood loss:  ?                          None. ?Estimated Blood Loss:     Estimated blood loss: none. ?Impression:               - The entire examined colon is normal on direct and  ?                          retroflexion views. ?Recommendation:           - Repeat colonoscopy in 10 years for screening  ?                          purposes. ?                          - Patient has a contact number available for  ?                          emergencies. The signs and symptoms of potential  ?                          delayed complications were discussed with the  ?                          patient. Return to normal activities tomorrow.  ?                          Written discharge instructions were provided to the  ?                          patient. ?                          - Resume previous diet. ?                           - Continue present medications. ?                          - Await pathology results. ?Docia Chuck. Henrene Pastor, MD ?02/04/2022 4:10:53 PM ?This report has been signed electronically. ?

## 2022-02-04 NOTE — Progress Notes (Signed)
Sedate, gd SR, tolerated procedure well, VSS, report to RN 

## 2022-02-06 ENCOUNTER — Telehealth: Payer: Self-pay | Admitting: *Deleted

## 2022-02-06 NOTE — Telephone Encounter (Signed)
?  Follow up Call- ? ? ?  02/04/2022  ?  2:52 PM  ?Call back number  ?Post procedure Call Back phone  # [phone]  ?Permission to leave phone message Yes  ? LMOM ?

## 2022-02-06 NOTE — Telephone Encounter (Signed)
?  Follow up Call- ? ? ?  02/04/2022  ?  2:52 PM  ?Call back number  ?Post procedure Call Back phone  # [phone]  ?Permission to leave phone message Yes  ?  ? ?Patient questions: ? ?Do you have a fever, pain , or abdominal swelling? No. ?Pain Score  0 * ? ?Have you tolerated food without any problems? Yes.   ? ?Have you been able to return to your normal activities? Yes.   ? ?Do you have any questions about your discharge instructions: ?Diet   No. ?Medications  No. ?Follow up visit  No. ? ?Do you have questions or concerns about your Care? No. ? ?Actions: ?* If pain score is 4 or above: ?No action needed, pain <4. ? ? ?

## 2022-02-07 ENCOUNTER — Encounter: Payer: Self-pay | Admitting: Internal Medicine

## 2022-02-14 DIAGNOSIS — R509 Fever, unspecified: Secondary | ICD-10-CM | POA: Diagnosis not present

## 2022-02-14 DIAGNOSIS — J45909 Unspecified asthma, uncomplicated: Secondary | ICD-10-CM | POA: Diagnosis not present

## 2022-02-14 DIAGNOSIS — R5383 Other fatigue: Secondary | ICD-10-CM | POA: Diagnosis not present

## 2022-02-14 DIAGNOSIS — E1165 Type 2 diabetes mellitus with hyperglycemia: Secondary | ICD-10-CM | POA: Diagnosis not present

## 2022-02-17 NOTE — Progress Notes (Deleted)
?Cardiology Office Note:   ? ?Date:  02/17/2022  ? ?ID:  Rebecca Camacho, Rebecca Camacho February 12, 1956, MRN [id number] ? ?PCP:  Garlan Fillers, MD ?  ?[facility] Providers ?Cardiologist:  None { ?  ? ?Referring MD: Garlan Fillers, MD  ? ? ?History of Present Illness:   ? ?Rebecca Camacho is a 66 y.o. female with a hx of DMII, HTN, HLD, obesity s/p gastric bypass surgery, chronic venous insufficiency, and hypothyroidism who presents to clinic for follow-up. ? ?Was last seen in clinic on 01/07/22 where she was suffering with elevated blood pressures and LE edema. We were concerned for possible HFrEF. BNP was normal. TTE 01/15/22 showed LVEF 73%, GLS -22.8%, elevated LVEDP, normal RV, mild LAE, mild MR.  She was placed on lasix and her antihypertensives were increased. She was also undergoing work-up for possible congenital abnormality in renal blood flow. NM renal scan showed prompt symmetric arterial flow to both kidneys. 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 ? ?Today, ? ?Past Medical History:  ?Diagnosis Date  ? Arthritis   ? Asthma   ? Chronic venous insufficiency   ? vascular-- dr Edilia Bo-- bilateral deep vein and superficial reflux  ? Hyperlipidemia   ? Hypertension   ? Hypothyroidism   ? MVP (mitral valve prolapse)   ? PONV (postoperative nausea and vomiting)   ? severe  ? 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 Type 2 diabetes mellitus treated with insulin ([facility])   ? Varicose veins of both lower extremities   ? ? ?Past Surgical History:  ?Procedure Laterality Date  ? ACHILLES TENDON REPAIR Left mid 2000s  ? CARPAL TUNNEL RELEASE Bilateral 1988  ? CATARACT EXTRACTION W/ INTRAOCULAR LENS  IMPLANT, BILATERAL  10/ 2018;  2014 approx.  ? CHOLECYSTECTOMY    ? 2014  ? COLONOSCOPY  02/04/2010  ? Dr.Perry  ? GASTRIC BYPASS  08/2012  ? KNEE ARTHROSCOPY Right early 2000  ? AND  RIGHT ACHILLES TENDON REPAIR   ? KNEE CLOSED REDUCTION  Right 07/05/2018  ? Procedure: CLOSED MANIPULATION RIGHT KNEE;  Surgeon: Ollen Gross, MD;  Location: [facility];  Service: Orthopedics;  Laterality: Right;   ? ROTATOR CUFF REPAIR Right 2000  ? TONSILLECTOMY  child  ? TOTAL KNEE ARTHROPLASTY Right 04/05/2018  ? Procedure: RIGHT TOTAL KNEE ARTHROPLASTY;  Surgeon: Ollen Gross, MD;  Location: [facility];  Service: Orthopedics;  Laterality: Right;  ? WISDOM TOOTH EXTRACTION    ? ? ?Current Medications: ?No outpatient medications have been marked as taking for the 02/21/22 encounter (Appointment) with Meriam Sprague, MD.  ?  ? ?Allergies:   Patient has no known allergies.  ? ?Social History  ? ?Socioeconomic History  ? Marital status: Married  ?  Spouse name: Not on file  ? Number of children: 3  ? Years of education: Not on file  ? Highest education level: Not on file  ?Occupational History  ? Occupation: retired  ?Tobacco Use  ? Smoking status: Never  ? Smokeless tobacco: Never  ?Vaping Use  ? Vaping Use: Never used  ?Substance and Sexual Activity  ? Alcohol use: Yes  ?  Comment: occasional  ? Drug use: No  ? Sexual activity: Not on file  ?Other Topics Concern  ? Not on file  ?Social History Narrative  ? Not on file  ? ?Social Determinants of Health  ? ?Financial Resource Strain: Not on file  ?Food Insecurity: Not  on file  ?Transportation Needs: Not on file  ?Physical Activity: Not on file  ?Stress: Not on file  ?Social Connections: Not on file  ?  ? ?Family History: ?The patient's family history includes Cancer in her mother; Diabetes in her father, maternal grandfather, and paternal grandfather; Heart attack in her father; Heart disease in her father; Hyperlipidemia in her brother and father; Hypertension in her father; Other in her father. There is no history of Colon cancer, Rectal cancer, Stomach cancer, Esophageal cancer, Pancreatic cancer, or Lung cancer. ? ?ROS:   ?Please see the history of present illness.    ?Review of Systems  ?Constitutional:   Positive for malaise/fatigue. Negative for fever.  ?HENT:  Negative for congestion and sore throat.   ?Eyes:  Negative for blurred vision.  ?Respiratory:  Negative for cough and shortness of breath.   ?Cardiovascular:  Positive for leg swelling (Bilateral LE). Negative for chest pain, palpitations, orthopnea, claudication and PND.  ?Gastrointestinal:  Negative for heartburn and nausea.  ?Genitourinary:  Positive for dysuria and frequency.  ?Musculoskeletal:  Negative for joint pain and myalgias.  ?Skin:  Negative for itching and rash.  ?Neurological:  Negative for dizziness and headaches.  ?Endo/Heme/Allergies:  Does not bruise/bleed easily.  ?Psychiatric/Behavioral:  The patient is not nervous/anxious and does not have insomnia.   ?All other systems reviewed and are negative. ? ?EKGs/Labs/Other Studies Reviewed:   ? ?The following studies were reviewed today: ?TTE 12/2021: ?IMPRESSIONS  ? 1. Left ventricular ejection fraction by 3D volume is 73 %. The left  ?ventricle has hyperdynamic function. The left ventricle has no regional  ?wall motion abnormalities. Left ventricular diastolic function could not  ?be evaluated. Elevated left ventricular  ? end-diastolic pressure. The average left ventricular global longitudinal  ?strain is -22.8 %. The global longitudinal strain is normal.  ? 2. Right ventricular systolic function is normal. The right ventricular  ?size is normal.  ? 3. Left atrial size was mildly dilated.  ? 4. The mitral valve is degenerative. Mild mitral valve regurgitation. No  ?evidence of mitral stenosis. Moderate mitral annular calcification.  ? 5. The aortic valve is normal in structure. Aortic valve regurgitation is  ?not visualized. Aortic valve sclerosis/calcification is present, without  ?any evidence of aortic stenosis.  ? 6. The inferior vena cava is normal in size with greater than 50%  ?respiratory variability, suggesting right atrial pressure of 3 mmHg.  ?CT Abdomen 12/13/21: ?IMPRESSION: ?1.  No acute findings in the abdomen/pelvis. ?2. Moderate prominence of the left renal pelvis and mild associated left hydronephrosis. Findings may be due to acute mild degree of obstruction versus chronic UPJ obstruction. No renal/ureteral stones visualized. ?3. Postsurgical changes over the gastroesophageal junction and stomach. ?4. Aortic atherosclerosis. ?  ?Aortic Atherosclerosis (ICD10-I70.0). ? ?EKG:   ?01/07/22: Sinus bradycardia, rate 56 bpm; LVH ? ?Recent Labs: ?12/09/2021: Hemoglobin 12.5; Platelets 237.0 ?01/15/2022: BUN 13; Creatinine, Ser 0.67; NT-Pro BNP 258; Potassium 3.5; Sodium 146  ?Recent Lipid Panel ?No results found for: CHOL, TRIG, HDL, CHOLHDL, VLDL, LDLCALC, LDLDIRECT ? ? ?Risk Assessment/Calculations:   ?  ? ?    ? ?Physical Exam:   ? ?VS:  There were no vitals taken for this visit.   ? ?Wt Readings from Last 3 Encounters:  ?02/04/22 155 lb (70.3 kg)  ?01/20/22 155 lb (70.3 kg)  ?01/07/22 155 lb 3.2 oz (70.4 kg)  ?  ? ?GEN: Well nourished, well developed in no acute distress ?HEENT: Normal ?NECK: No JVD; No carotid  bruits ?CARDIAC: Bradycardic, 2/6 early peaking systolic murmur at the RUSB, no rubs or gallops ?RESPIRATORY:  Clear to auscultation without rales, wheezing or rhonchi ?ABDOMEN: Soft, non-tender, non-distended ?MUSCULOSKELETAL: trace to 1+ bilateral LE edema; No deformity  ?SKIN: Warm and dry ?NEUROLOGIC:  Alert and oriented x 3 ?PSYCHIATRIC:  Normal affect  ? ?ASSESSMENT:   ? ?No diagnosis found. ? ?PLAN:   ? ?In order of problems listed above: ? ?#Suspected HFpEF: ?Patient presented with worsening LE edema and severely elevated blood pressures on initial visit. TTE with normal BiV function, elevated LVEDP, normal GLS, mild MR, no other significant valve disease. BNP was normal, however, given elevated LVEDP and volume overload, suspect diastiolic dysfunction was contributing to her clinical picture. Currently, *** ?-Continue lasix  daily ?-Continue amlodipine  daily ?-Continue  spironolactone  daily ?-Continue olmesartan  daily ?-Low Na diet ?-Monitor daily weights and call if gaining >3lbs in 1-2 days or >5lbs in 3-5 days ? ?#HTN: ?-Continue amlodipine  daily ?-Contin

## 2022-02-21 ENCOUNTER — Encounter: Payer: Self-pay | Admitting: Cardiology

## 2022-02-21 ENCOUNTER — Ambulatory Visit (INDEPENDENT_AMBULATORY_CARE_PROVIDER_SITE_OTHER): Payer: Medicare Other | Admitting: Cardiology

## 2022-02-21 VITALS — BP 116/68 | HR 63 | Ht 64.0 in | Wt 150.6 lb

## 2022-02-21 DIAGNOSIS — E119 Type 2 diabetes mellitus without complications: Secondary | ICD-10-CM

## 2022-02-21 DIAGNOSIS — I10 Essential (primary) hypertension: Secondary | ICD-10-CM | POA: Diagnosis not present

## 2022-02-21 DIAGNOSIS — I5032 Chronic diastolic (congestive) heart failure: Secondary | ICD-10-CM | POA: Diagnosis not present

## 2022-02-21 DIAGNOSIS — E7800 Pure hypercholesterolemia, unspecified: Secondary | ICD-10-CM

## 2022-02-21 DIAGNOSIS — Z79899 Other long term (current) drug therapy: Secondary | ICD-10-CM

## 2022-02-21 LAB — BASIC METABOLIC PANEL
BUN/Creatinine Ratio: 14 (ref 12–28)
BUN: 12 mg/dL (ref 8–27)
CO2: 27 mmol/L (ref 20–29)
Calcium: 9.7 mg/dL (ref 8.7–10.3)
Chloride: 98 mmol/L (ref 96–106)
Creatinine, Ser: 0.86 mg/dL (ref 0.57–1.00)
Glucose: 80 mg/dL (ref 70–99)
Potassium: 4.1 mmol/L (ref 3.5–5.2)
Sodium: 139 mmol/L (ref 134–144)
eGFR: 74 mL/min/{1.73_m2} (ref >59)

## 2022-02-21 NOTE — Patient Instructions (Signed)
Medication Instructions:  ? ?Your physician recommends that you continue on your current medications as directed. Please refer to the Current Medication list given to you today. ? ?*If you need a refill on your cardiac medications before your next appointment, please call your pharmacy* ? ? ?Lab Work: ? ?TODAY--BMET ? ?If you have labs (blood work) drawn today and your tests are completely normal, you will receive your results only by: ?MyChart Message (if you have MyChart) OR ?A paper copy in the mail ?If you have any lab test that is abnormal or we need to change your treatment, we will call you to review the results. ? ? ?Follow-Up: ?At [facility], you and your health needs are our priority.  As part of our continuing mission to provide you with exceptional heart care, we have created designated Provider Care Teams.  These Care Teams include your primary Cardiologist (physician) and Advanced Practice Providers (APPs -  Physician Assistants and Nurse Practitioners) who all work together to provide you with the care you need, when you need it. ? ?We recommend signing up for the patient portal called "MyChart".  Sign up information is provided on this After Visit Summary.  MyChart is used to connect with patients for Virtual Visits (Telemedicine).  Patients are able to view lab/test results, encounter notes, upcoming appointments, etc.  Non-urgent messages can be sent to your provider as well.   ?To learn more about what you can do with MyChart, go to [email or web address].   ? ?Your next appointment:   ?6 month(s) ? ?The format for your next appointment:   ?In Person ? ?Provider:   ?DR. PEMBERTON  ? ? ?Other Instructions ? ?WHEN YOU GO TO YOUR DIABETES FOLLOW-UP APPOINTMENT NEXT WEEK, PLEASE CLARIFY WITH THE PROVIDER IF THEY ARE OK WITH [place] STARTING YOU ON FARXIGA, BEING YOU ARE ON INSULIN ? ?Important Information About Sugar ? ? ? ? ? ? ?

## 2022-02-21 NOTE — Progress Notes (Signed)
?Cardiology Office Note:   ? ?Date:  02/21/2022  ? ?ID:  Rebecca Camacho, Rebecca Camacho November 12, 1955, MRN [id number] ? ?PCP:  Donnajean Lopes, MD ?  ?[facility] Providers ?Cardiologist:  None { ?  ? ?Referring MD: Donnajean Lopes, MD  ? ? ?History of Present Illness:   ? ?Rebecca Camacho is a 66 y.o. female with a hx of DMII, HTN, HLD, obesity s/p gastric bypass surgery, chronic venous insufficiency, and hypothyroidism who presents to clinic for follow-up. ? ?Was last seen in clinic on 01/07/22 where she was suffering with elevated blood pressures and LE edema. We were concerned for possible HFrEF. BNP was normal. TTE 01/15/22 showed LVEF 73%, GLS -22.8%, elevated LVEDP, normal RV, mild LAE, mild MR.  She was placed on lasix and her antihypertensives were increased. She was also undergoing work-up for possible congenital abnormality in renal blood flow. NM renal scan showed prompt symmetric arterial flow to both kidneys. 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 ? ?She is accompanied by a family member. Today, the patient states that she is feeling good and her swelling has resolved. She has lost about 5 lbs since her last appointment. ? ?In the last few days she has noticed sensations in her RLE that "felt like little cigarette burns on her legs". She has not seen any lesions or erythematous skin on her legs. Of note, she states that this sensation had occurred on her LLE when she had edema. ? ?Her only shortness of breath she experiences is attributable to her seasonal allergies. She notes her current allergy medications do not seem to do much to improve her symptoms. ? ?Additionally she complains of varicose veins in her bilateral LE. ? ?She denies any palpitations, chest pain. No lightheadedness, headaches, syncope, orthopnea, or PND. ? ?Dr. Philip Aspen manages her Insulin and follows her type 2 diabetes mellitus. ? ?Past  Medical History:  ?Diagnosis Date  ? Arthritis   ? Asthma   ? Chronic venous insufficiency   ? vascular-- dr Scot Dock-- bilateral deep vein and superficial reflux  ? Hyperlipidemia   ? Hypertension   ? Hypothyroidism   ? MVP (mitral valve prolapse)   ? PONV (postoperative nausea and vomiting)   ? severe  ? 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 Type 2 diabetes mellitus treated with insulin ([place])   ? Varicose veins of both lower extremities   ? ? ?Past Surgical History:  ?Procedure Laterality Date  ? ACHILLES TENDON REPAIR Left mid 2000s  ? CARPAL TUNNEL RELEASE Bilateral 1988  ? CATARACT EXTRACTION W/ INTRAOCULAR LENS  IMPLANT, BILATERAL  10/ 2018;  2014 approx.  ? CHOLECYSTECTOMY    ? 2014  ? COLONOSCOPY  02/04/2010  ? Dr.Perry  ? GASTRIC BYPASS  08/2012  ? KNEE ARTHROSCOPY Right early 2000  ? AND  RIGHT ACHILLES TENDON REPAIR   ? KNEE CLOSED REDUCTION Right 07/05/2018  ? Procedure: CLOSED MANIPULATION RIGHT KNEE;  Surgeon: Gaynelle Arabian, MD;  Location: [facility];  Service: Orthopedics;  Laterality: Right;  75min  ? ROTATOR CUFF REPAIR Right 2000  ? TONSILLECTOMY  child  ? TOTAL KNEE ARTHROPLASTY Right 04/05/2018  ? Procedure: RIGHT TOTAL KNEE ARTHROPLASTY;  Surgeon: Gaynelle Arabian, MD;  Location: [facility];  Service: Orthopedics;  Laterality: Right;  ? WISDOM TOOTH EXTRACTION    ? ? ?Current Medications: ?Current Meds  ?Medication Sig  ? albuterol (VENTOLIN HFA) 108 (90 Base) MCG/ACT inhaler Inhale 2  puffs into the lungs every 6 (six) hours as needed for wheezing or shortness of breath.   ? amLODipine (NORVASC) 10 MG tablet Take 1 tablet (10 mg total) by mouth daily.  ? Biotin 5000 MCG CAPS Take 5,000 mcg by mouth daily.  ? Calcium Carb-Cholecalciferol 500-400 MG-UNIT TABS Take 1 tablet by mouth at bedtime.  ? Cholecalciferol (VITAMIN D3) 5000 units CAPS Take 10,000 Units by mouth daily at 2 PM.  ? Cyanocobalamin (B-12) 2500 MCG TABS Take 2,500 mcg by mouth every Wednesday.  ? dicyclomine (BENTYL) 10 MG capsule Take 1 capsule  (10 mg total) by mouth 2 (two) times daily as needed for spasms (abdominal pain).  ? ferrous sulfate 325 (65 FE) MG EC tablet Take 325 mg by mouth daily at 2 PM.   ? furosemide (LASIX) 20 MG tablet Take 1 tablet (20 mg total) by mouth daily. Take 1 tablet (20 mg total) by mouth twice daily for 2 days only, then decrease to taking 1 tablet (20 mg total) by mouth daily thereafter.  ? gabapentin (NEURONTIN) 300 MG capsule 300 mg as needed.  ? insulin glargine (LANTUS SOLOSTAR) 100 UNIT/ML Solostar Pen Lantus Solostar U-100 Insulin 100 unit/mL (3 mL) subcutaneous pen ? INJECT 16 UNITS UNDER THE SKIN ONCE DAILY  ? insulin lispro (HUMALOG) 100 UNIT/ML KwikPen Inject into the skin. Per sliding scale with meals  ? levothyroxine (SYNTHROID) 100 MCG tablet 100 mcg daily.  ? Multiple Vitamin (MULTIVITAMIN) tablet Take 4 tablets by mouth daily at 2 PM.   ? olmesartan (BENICAR) 40 MG tablet 40 mg daily.  ? ONE TOUCH ULTRA TEST test strip   ? ONETOUCH DELICA LANCETS FINE MISC   ? potassium chloride SA (KLOR-CON M) 20 MEQ tablet Take 1 tablet (20 mEq total) by mouth daily.  ? pravastatin (PRAVACHOL) 40 MG tablet Take 40 mg by mouth every morning.   ? spironolactone (ALDACTONE) 25 MG tablet Take 1 tablet (25 mg total) by mouth daily.  ?  ? ?Allergies:   Patient has no known allergies.  ? ?Social History  ? ?Socioeconomic History  ? Marital status: Married  ?  Spouse name: Not on file  ? Number of children: 3  ? Years of education: Not on file  ? Highest education level: Not on file  ?Occupational History  ? Occupation: retired  ?Tobacco Use  ? Smoking status: Never  ? Smokeless tobacco: Never  ?Vaping Use  ? Vaping Use: Never used  ?Substance and Sexual Activity  ? Alcohol use: Yes  ?  Comment: occasional  ? Drug use: No  ? Sexual activity: Not on file  ?Other Topics Concern  ? Not on file  ?Social History Narrative  ? Not on file  ? ?Social Determinants of Health  ? ?Financial Resource Strain: Not on file  ?Food Insecurity: Not on  file  ?Transportation Needs: Not on file  ?Physical Activity: Not on file  ?Stress: Not on file  ?Social Connections: Not on file  ?  ? ?Family History: ?The patient's family history includes Cancer in her mother; Diabetes in her father, maternal grandfather, and paternal grandfather; Heart attack in her father; Heart disease in her father; Hyperlipidemia in her brother and father; Hypertension in her father; Other in her father. There is no history of Colon cancer, Rectal cancer, Stomach cancer, Esophageal cancer, Pancreatic cancer, or Lung cancer. ? ?ROS:   ?Please see the history of present illness.    ?Review of Systems  ?Constitutional:  Positive for  malaise/fatigue. Negative for fever.  ?HENT:  Negative for congestion and sore throat.   ?Eyes:  Negative for blurred vision.  ?Respiratory:  Positive for shortness of breath. Negative for cough.   ?Cardiovascular:  Negative for chest pain, palpitations, orthopnea, claudication, leg swelling and PND.  ?Gastrointestinal:  Negative for heartburn and nausea.  ?Genitourinary:  Positive for dysuria and frequency.  ?Musculoskeletal:  Negative for joint pain and myalgias.  ?Skin:  Negative for itching and rash.  ?Neurological:  Negative for dizziness and headaches.  ?Endo/Heme/Allergies:  Positive for environmental allergies. Does not bruise/bleed easily.  ?Psychiatric/Behavioral:  The patient is not nervous/anxious and does not have insomnia.   ?All other systems reviewed and are negative. ? ?EKGs/Labs/Other Studies Reviewed:   ? ?The following studies were reviewed today: ? ?TTE 12/2021: ?IMPRESSIONS  ? 1. Left ventricular ejection fraction by 3D volume is 73 %. The left  ?ventricle has hyperdynamic function. The left ventricle has no regional  ?wall motion abnormalities. Left ventricular diastolic function could not  ?be evaluated. Elevated left ventricular  ? end-diastolic pressure. The average left ventricular global longitudinal  ?strain is -22.8 %. The global  longitudinal strain is normal.  ? 2. Right ventricular systolic function is normal. The right ventricular  ?size is normal.  ? 3. Left atrial size was mildly dilated.  ? 4. The mitral valve is degenerative

## 2022-02-26 DIAGNOSIS — Z794 Long term (current) use of insulin: Secondary | ICD-10-CM | POA: Diagnosis not present

## 2022-02-26 DIAGNOSIS — E782 Mixed hyperlipidemia: Secondary | ICD-10-CM | POA: Diagnosis not present

## 2022-02-26 DIAGNOSIS — I10 Essential (primary) hypertension: Secondary | ICD-10-CM | POA: Diagnosis not present

## 2022-02-26 DIAGNOSIS — E11319 Type 2 diabetes mellitus with unspecified diabetic retinopathy without macular edema: Secondary | ICD-10-CM | POA: Diagnosis not present

## 2022-03-05 ENCOUNTER — Ambulatory Visit: Payer: Medicare Other | Admitting: Internal Medicine

## 2022-03-05 ENCOUNTER — Encounter: Payer: Self-pay | Admitting: Internal Medicine

## 2022-03-05 VITALS — BP 90/56 | HR 58 | Ht 61.25 in | Wt 146.5 lb

## 2022-03-05 DIAGNOSIS — R110 Nausea: Secondary | ICD-10-CM

## 2022-03-05 DIAGNOSIS — R197 Diarrhea, unspecified: Secondary | ICD-10-CM | POA: Diagnosis not present

## 2022-03-05 DIAGNOSIS — R1030 Lower abdominal pain, unspecified: Secondary | ICD-10-CM | POA: Diagnosis not present

## 2022-03-05 DIAGNOSIS — R1013 Epigastric pain: Secondary | ICD-10-CM | POA: Diagnosis not present

## 2022-03-05 MED ORDER — METRONIDAZOLE 250 MG PO TABS: 250.0000 mg | ORAL_TABLET | Freq: Four times a day (QID) | ORAL | 0 refills | Status: DC

## 2022-03-05 NOTE — Patient Instructions (Signed)
If you are age 68 or older, your body mass index should be between 23-30. Your Body mass index is 27.46 kg/m?Marland Kitchen If this is out of the aforementioned range listed, please consider follow up with your Primary Care Provider. ? ?If you are age 76 or younger, your body mass index should be between 19-25. Your Body mass index is 27.46 kg/m?Marland Kitchen If this is out of the aformentioned range listed, please consider follow up with your Primary Care Provider.  ? ?________________________________________________________ ? ?The [facility] providers would like to encourage you to use [facility] to communicate with providers for non-urgent requests or questions.  Due to long hold times on the telephone, sending your provider a message by [facility] may be a faster and more efficient way to get a response.  Please allow 48 business hours for a response.  Please remember that this is for non-urgent requests.  ?_______________________________________________________ ? ?We have sent the following medications to your pharmacy for you to pick up at your convenience:  Flagyl ? ?

## 2022-03-05 NOTE — Progress Notes (Signed)
HISTORY OF PRESENT ILLNESS: ? ?Rebecca Camacho is a 66 y.o. female with past medical history as listed below.  She was evaluated in the office December 09, 2021 regarding change in bowel habits (loose) and abdominal pain.  See that dictation.  She subsequently underwent CT scan of the abdomen and pelvis with contrast December 13, 2021.  There were no acute abdominal or pelvic findings.  Moderate prominence of the left renal pelvis with associated hydronephrosis noted.  This was addressed by her PCP.  Stool studies and blood work were unremarkable.  She was set up for colonoscopy and upper endoscopy.  These were performed February 04, 2022.  Colonoscopy was unremarkable.  Random colon biopsies were normal.  No microscopic colitis.  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  No other abnormalities.  She was told to take Citrucel and follow-up at this time.  Patient tells me that she did not take Citrucel with any regularity.  She has been taking a probiotic.  She tells me that her symptoms have improved but, not completely.  She does describe issues with lower abdominal cramping followed by somewhat loose stools.  Discomfort resolves thereafter.  She has not needed Bentyl.  She is status post remote cholecystectomy.  She also complains of increased gas.  She does notice that her stools float and look a little oily.  She has had 10 to 15 pound weight loss.  Otherwise she feels well. ? ?REVIEW OF SYSTEMS: ? ?All non-GI ROS negative unless otherwise stated in the HPI except for sinus and allergy trouble, cough, sore throat, shortness of breath ? ?Past Medical History:  ?Diagnosis Date  ? Arthritis   ? Asthma   ? Chronic venous insufficiency   ? vascular-- dr Edilia Bo-- bilateral deep vein and superficial reflux  ? Hyperlipidemia   ? Hypertension   ? Hypothyroidism   ? MVP (mitral valve prolapse)   ? PONV (postoperative nausea and vomiting)   ? severe  ? 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  Type 2 diabetes mellitus treated with insulin (HCC)   ? Varicose veins of both lower extremities   ? ? ?Past Surgical History:  ?Procedure Laterality Date  ? ACHILLES TENDON REPAIR Left mid 2000s  ? CARPAL TUNNEL RELEASE Bilateral 1988  ? CATARACT EXTRACTION W/ INTRAOCULAR LENS  IMPLANT, BILATERAL  10/ 2018;  2014 approx.  ? CHOLECYSTECTOMY    ? 2014  ? COLONOSCOPY  02/04/2010  ? Dr.Isrrael Fluckiger  ? GASTRIC BYPASS  08/2012  ? KNEE ARTHROSCOPY Right early 2000  ? AND  RIGHT ACHILLES TENDON REPAIR   ? KNEE CLOSED REDUCTION Right 07/05/2018  ? Procedure: CLOSED MANIPULATION RIGHT KNEE;  Surgeon: Ollen Gross, MD;  Location: [facility];  Service: Orthopedics;  Laterality: Right;   ? ROTATOR CUFF REPAIR Right 2000  ? TONSILLECTOMY  child  ? TOTAL KNEE ARTHROPLASTY Right 04/05/2018  ? Procedure: RIGHT TOTAL KNEE ARTHROPLASTY;  Surgeon: Ollen Gross, MD;  Location: [facility];  Service: Orthopedics;  Laterality: Right;  ? WISDOM TOOTH EXTRACTION    ? ? ?Social History ?[place] [facility] [place]  reports that she has never smoked. She has never used smokeless tobacco. She reports current alcohol use. She reports that she does not use drugs. ? ?family history includes Cancer in her mother; Diabetes in her father, maternal grandfather, and paternal grandfather; Heart attack in her father; Heart disease in her father; Hyperlipidemia in her brother and father; Hypertension in her father; Other in her father. ? ?  No Known Allergies ? ?  ? ?PHYSICAL EXAMINATION: ?Vital signs: Pulse (!) 58   Ht 5' 1.25" (1.556 m) Comment: height measured without shoes  Wt 146 lb 8 oz (66.5 kg)   BMI 27.46 kg/m?   ?Constitutional: generally well-appearing, no acute distress ?Psychiatric: alert and oriented x3, cooperative ?Eyes: extraocular movements intact, anicteric, conjunctiva pink ?Mouth: oral pharynx moist, no lesions ?Neck: supple no lymphadenopathy ?Cardiovascular: heart regular rate and rhythm, no murmur ?Lungs: clear to auscultation  bilaterally ?Abdomen: soft, nontender, nondistended, no obvious ascites, no peritoneal signs, normal bowel sounds, no organomegaly ?Rectal: Omitted ?Extremities: no clubbing, cyanosis, or lower extremity edema bilaterally ?Skin: no lesions on visible extremities ?Neuro: No focal deficits.  Cranial nerves intact ? ?ASSESSMENT: ? ?1.  Abdominal cramping with loose stools.  She describes diarrhea.  Suspect bacterial overgrowth from prior surgery.  Question pancreatic insufficiency. ?2.  Colonoscopy, upper endoscopy, advanced imaging, laboratories, microbiology all unremarkable. ?3.  Status post Roux-en-Y gastric bypass surgery ? ? ?PLAN: ? ?1.  Prescribe metronidazole 250 mg 4 times daily for 10 days.  Medication risks reviewed. ?2.  If this is not helpful, consider trial of pancreatic enzymes ?3.  GI follow-up as needed.  She does contact the office as I instructed her for persistent or worsening problems. ?A total time of 30 minutes was spent preparing to see the patient, obtaining history, performing medically appropriate physical examination, counseling and educating the patient regarding the above listed issues, ordering medication, and documenting clinical information in the health record ? ? ? ?  ?

## 2022-04-17 DIAGNOSIS — E782 Mixed hyperlipidemia: Secondary | ICD-10-CM | POA: Diagnosis not present

## 2022-04-17 DIAGNOSIS — E039 Hypothyroidism, unspecified: Secondary | ICD-10-CM | POA: Diagnosis not present

## 2022-04-17 DIAGNOSIS — I10 Essential (primary) hypertension: Secondary | ICD-10-CM | POA: Diagnosis not present

## 2022-04-17 DIAGNOSIS — R739 Hyperglycemia, unspecified: Secondary | ICD-10-CM | POA: Diagnosis not present

## 2022-04-24 DIAGNOSIS — R82998 Other abnormal findings in urine: Secondary | ICD-10-CM | POA: Diagnosis not present

## 2022-04-24 DIAGNOSIS — E039 Hypothyroidism, unspecified: Secondary | ICD-10-CM | POA: Diagnosis not present

## 2022-04-24 DIAGNOSIS — E1139 Type 2 diabetes mellitus with other diabetic ophthalmic complication: Secondary | ICD-10-CM | POA: Diagnosis not present

## 2022-04-24 DIAGNOSIS — K909 Intestinal malabsorption, unspecified: Secondary | ICD-10-CM | POA: Diagnosis not present

## 2022-04-24 DIAGNOSIS — Z0000 Encounter for general adult medical examination without abnormal findings: Secondary | ICD-10-CM | POA: Diagnosis not present

## 2022-04-24 DIAGNOSIS — E782 Mixed hyperlipidemia: Secondary | ICD-10-CM | POA: Diagnosis not present

## 2022-04-24 DIAGNOSIS — E119 Type 2 diabetes mellitus without complications: Secondary | ICD-10-CM | POA: Diagnosis not present

## 2022-05-26 DIAGNOSIS — E119 Type 2 diabetes mellitus without complications: Secondary | ICD-10-CM | POA: Diagnosis not present

## 2022-05-26 NOTE — Progress Notes (Unsigned)
05/26/2022 Koleen Nimrod AVRIL BUSSER [id number] Mar 10, 1956  Referring provider: Garlan Fillers, MD Primary GI doctor: Dr. Marina Goodell  ASSESSMENT AND PLAN:   There are no diagnoses linked to this encounter.  History of Present Illness:  66 y.o. female  with a past medical history of hypertension, diabetes, chronic diastolic heart failure (TTE 01/15/2022 showed EF 73%, elevated left ventricular EDP, normal RV mild LAE mild MR) vitamin D deficiency, dyslipidemia, history of gastric bypass 2013 and cholecystectomy 2014 and others listed below, returns to clinic today to discuss medication changes  12/09/2021 office visit with [facility] nurse practitioner for abnormal stools and abdominal bloating. 12/13/2021 CT abdomen pelvis with contrast no acute abdominal pelvis finding shows moderate prominence of left renal pelvis and mild associated left hydronephrosis obstruction versus chronic UPJ obstruction no renal stones, postoperative changes gastroesophageal junction and stomach. Had subsequent renal imaging with Dr. Marlou Porch showed mild to moderate UPJ obstruction normal right renogram. 02/04/2022 colonoscopy endoscopy with Dr. Marina Goodell EGD showed status post Roux-en-Y without complicating features, no abnormalities.  Colonoscopy showed Suprep via split instruction, excellent prep.  Unremarkable colonoscopy recall 10 years 02/04/2010 colonoscopy with Dr. Marina Goodell showed diverticulosis sigmoid no polyps.  Current Medications:   Current Outpatient Medications (Endocrine & Metabolic):    FARXIGA 10 MG TABS tablet, Take 10 mg by mouth daily.   insulin glargine (LANTUS SOLOSTAR) 100 UNIT/ML Solostar Pen, Lantus Solostar U-100 Insulin 100 unit/mL (3 mL) subcutaneous pen  INJECT 16 UNITS UNDER THE SKIN ONCE DAILY   insulin lispro (HUMALOG) 100 UNIT/ML KwikPen, Inject into the skin. Per sliding scale with meals   levothyroxine (SYNTHROID) 100 MCG tablet, 100 mcg daily.  Current Outpatient Medications  (Cardiovascular):    amLODipine (NORVASC) 10 MG tablet, Take 1 tablet (10 mg total) by mouth daily.   furosemide (LASIX) 20 MG tablet, Take 1 tablet (20 mg total) by mouth daily. Take 1 tablet (20 mg total) by mouth twice daily for 2 days only, then decrease to taking 1 tablet (20 mg total) by mouth daily thereafter.   olmesartan (BENICAR) 40 MG tablet, 40 mg daily.   pravastatin (PRAVACHOL) 40 MG tablet, Take 40 mg by mouth every morning.    spironolactone (ALDACTONE) 25 MG tablet, Take 1 tablet (25 mg total) by mouth daily.  Current Outpatient Medications (Respiratory):    albuterol (VENTOLIN HFA) 108 (90 Base) MCG/ACT inhaler, Inhale 2 puffs into the lungs every 6 (six) hours as needed for wheezing or shortness of breath.    HYDROMET 5-1.5 MG/5ML syrup, Take 5 mLs by mouth at bedtime as needed.   Current Outpatient Medications (Hematological):    Cyanocobalamin (B-12) 2500 MCG TABS, Take 2,500 mcg by mouth every Wednesday.   ferrous sulfate 325 (65 FE) MG EC tablet, Take 325 mg by mouth daily at 2 PM.   Current Outpatient Medications (Other):    Biotin 5000 MCG CAPS, Take 5,000 mcg by mouth daily.   Calcium Carb-Cholecalciferol 500-400 MG-UNIT TABS, Take 1 tablet by mouth at bedtime.   Cholecalciferol (VITAMIN D3) 5000 units CAPS, Take 10,000 Units by mouth daily at 2 PM.   dicyclomine (BENTYL) 10 MG capsule, Take 1 capsule (10 mg total) by mouth 2 (two) times daily as needed for spasms (abdominal pain).   gabapentin (NEURONTIN) 300 MG capsule, 300 mg as needed.   metroNIDAZOLE (FLAGYL) 250 MG tablet, Take 1 tablet (250 mg total) by mouth 4 (four) times daily.   Multiple Vitamin (MULTIVITAMIN) tablet, Take 4 tablets by mouth daily at 2  PM.    ONE TOUCH ULTRA TEST test strip,    ONETOUCH DELICA LANCETS FINE MISC,    potassium chloride SA (KLOR-CON M) 20 MEQ tablet, Take 1 tablet (20 mEq total) by mouth daily.  Surgical History:  She  has a past surgical history that includes Gastric  bypass (08/2012); Carpal tunnel release (Bilateral, 1988); Knee arthroscopy (Right, early 2000); Rotator cuff repair (Right, 2000); Achilles tendon repair (Left, mid 2000s); Colonoscopy (02/04/2010); Wisdom tooth extraction; Total knee arthroplasty (Right, 04/05/2018); Cataract extraction w/ intraocular lens  implant, bilateral (10/ 2018;  2014 approx.); Tonsillectomy (child); Knee Closed Reduction (Right, 07/05/2018); and Cholecystectomy. Family History:  Her family history includes Cancer in her mother; Diabetes in her father, maternal grandfather, and paternal grandfather; Heart attack in her father; Heart disease in her father; Hyperlipidemia in her brother and father; Hypertension in her father; Other in her father. Social History:   reports that she has never smoked. She has never used smokeless tobacco. She reports current alcohol use. She reports that she does not use drugs.  Current Medications, Allergies, Past Medical History, Past Surgical History, Family History and Social History were reviewed in [employer] record.  Physical Exam: There were no vitals taken for this visit. General:   Pleasant, well developed female in no acute distress Heart : Regular rate and rhythm; no murmurs Pulm: Clear anteriorly; no wheezing Abdomen:  {BlankSingle:19197::"Distended","Ridged","Soft"}, {BlankSingle:19197::"Flat","Obese","Non-distended"} AB, {BlankSingle:19197::"Absent","Hyperactive, tinkling","Hypoactive","Sluggish","Active"} bowel sounds. {actendernessAB:27319} tenderness {anatomy; site abdomen:5010}. {BlankMultiple:19196::"Without guarding","With guarding","Without rebound","With rebound"}, No organomegaly appreciated. Rectal: {acrectalexam:27461} Extremities:  {With/without:5700}  edema. Neurologic:  Alert and  oriented x4;  No focal deficits.  Psych:  Cooperative. Normal mood and affect.   Doree Albee, PA-C 05/26/22

## 2022-05-27 ENCOUNTER — Encounter: Payer: Self-pay | Admitting: Physician Assistant

## 2022-05-27 ENCOUNTER — Other Ambulatory Visit: Payer: Medicare Other

## 2022-05-27 ENCOUNTER — Ambulatory Visit: Payer: Medicare Other | Admitting: Physician Assistant

## 2022-05-27 VITALS — BP 128/60 | HR 49 | Ht 64.0 in | Wt 143.0 lb

## 2022-05-27 DIAGNOSIS — R140 Abdominal distension (gaseous): Secondary | ICD-10-CM | POA: Diagnosis not present

## 2022-05-27 DIAGNOSIS — R195 Other fecal abnormalities: Secondary | ICD-10-CM

## 2022-05-27 DIAGNOSIS — H5203 Hypermetropia, bilateral: Secondary | ICD-10-CM | POA: Diagnosis not present

## 2022-05-27 NOTE — Progress Notes (Signed)
Assessment and plan noted ?

## 2022-05-27 NOTE — Patient Instructions (Addendum)
Your provider has requested that you go to the basement level for lab work before leaving today. Press "B" on the elevator. The lab is located at the first door on the left as you exit the elevator.  You have been given a testing kit to check for small intestine bacterial overgrowth (SIBO) which is completed by a company named [facility]. Make sure to return your test in the mail using the return mailing label given to you along with the kit. Your demographic and insurance information have already been sent to the company and they should be in contact with you over the next 1-2 weeks regarding this test. [facility] will collect an upfront charge of $99.74 for commercial insurance plans and $209.74 is you are paying cash. Make sure to discuss with [facility] PRIOR to having the test to see if they have gotten information from your insurance company as to how much your testing will cost out of pocket, if any. Please keep in mind that you will be getting a call from phone number [phone] or a similar number. If you do not hear from them within this time frame, please call our office at [phone] or call [facility] directly at [phone].    Try these things below:  SEE IF YOU CAN GET OFF THE OLMESARTAN AND SWITCH TO ANOTHER MEDICATION.  THIS CAN CAUSE AN ENTEROPATHY THAT CAN CAUSE BLOATING, LOOSE STOOLS.   WILL TEST FOR SIBO Will test for cdiff.   Try trial off milk/lactose products.  Add fiber like benefiber or citracel once a day Can do trial of IBGard for AB pain EVERY DAY- Take 1-2 capsules once a day for maintence or twice a day during a flare if any worsening symptoms like blood in stool, weight loss, please call the office or go to the ER.    FODMAP stands for fermentable oligo-, di-, mono-saccharides and polyols (1). These are the scientific terms used to classify groups of carbs that are notorious for triggering digestive symptoms like bloating, gas and  stomach pain.   FODMAPs are found in a wide range of foods in varying amounts. Some foods contain just one type, while others contain several.  The main dietary sources of the four groups of FODMAPs include:  Oligosaccharides: Wheat, rye, legumes and various fruits and vegetables, such as garlic and onions.  Disaccharides: Milk, yogurt and soft cheese. Lactose is the main carb.  Monosaccharides: Various fruit including figs and mangoes, and sweeteners such as honey and agave nectar. Fructose is the main carb.  Polyols: Certain fruits and vegetables including blackberries and lychee, as well as some low-calorie sweeteners like those in sugar-free gum.   Keep a food diary. This will help you identify foods that cause symptoms. Write down: What you eat and when. What symptoms you have. When symptoms occur in relation to your meals. Avoid foods that cause symptoms. Talk with your dietitian about other ways to get the same nutrients that are in these foods. Eat your meals slowly, in a relaxed setting. Aim to eat 5-6 small meals per day. Do not skip meals. Drink enough fluids to keep your urine clear or pale yellow. If dairy products cause your symptoms to flare up, try eating less of them. You might be able to handle yogurt better than other dairy products because it contains bacteria that help with digestion.      Due to recent changes in healthcare laws, you may see the results of your imaging and laboratory studies on MyChart  before your provider has had a chance to review them.  We understand that in some cases there may be results that are confusing or concerning to you. Not all laboratory results come back in the same time frame and the provider may be waiting for multiple results in order to interpret others.  Please give [place] 48 hours in order for your provider to thoroughly review all the results before contacting the office for clarification of your results.    I appreciate the   opportunity to care for you  Thank You   [facility]

## 2022-05-28 LAB — IGA: Immunoglobulin A: 176 mg/dL (ref 70–320)

## 2022-05-28 LAB — TISSUE TRANSGLUTAMINASE, IGA: (tTG) Ab, IgA: <1 U/mL

## 2022-05-29 ENCOUNTER — Other Ambulatory Visit: Payer: Medicare Other

## 2022-05-29 DIAGNOSIS — R195 Other fecal abnormalities: Secondary | ICD-10-CM

## 2022-05-29 DIAGNOSIS — R140 Abdominal distension (gaseous): Secondary | ICD-10-CM

## 2022-05-30 ENCOUNTER — Telehealth: Payer: Self-pay | Admitting: Physician Assistant

## 2022-05-30 LAB — CLOSTRIDIUM DIFFICILE TOXIN B, QUALITATIVE, REAL-TIME PCR: Toxigenic C. Difficile by PCR: NOT DETECTED

## 2022-05-30 NOTE — Telephone Encounter (Signed)
Inbound call from patient stating that she was sent home with a SIBO  test and patient is wanting to know if she can take the test if she diabetic. Please advise.

## 2022-05-30 NOTE — Telephone Encounter (Signed)
Spoke with patient & advised that she could proceed with SIBO kit even though she is diabetic, and to take medications as she normally would.

## 2022-06-14 DIAGNOSIS — R195 Other fecal abnormalities: Secondary | ICD-10-CM | POA: Diagnosis not present

## 2022-06-14 DIAGNOSIS — R140 Abdominal distension (gaseous): Secondary | ICD-10-CM | POA: Diagnosis not present

## 2022-06-17 ENCOUNTER — Telehealth: Payer: Self-pay | Admitting: Physician Assistant

## 2022-06-17 MED ORDER — RIFAXIMIN 550 MG PO TABS: 550.0000 mg | ORAL_TABLET | Freq: Three times a day (TID) | ORAL | 0 refills | Status: AC

## 2022-06-17 NOTE — Telephone Encounter (Signed)
Patient is feeling better off the olmesartan, still some gas/loose stools.  Hydrogen breath test positive.  Will send in Xifaxan 550 mg TID x 14 days, may substitute with doxycycline 100 mg BID for 14 days if Xifaxan is cost prohibitive.

## 2022-06-18 ENCOUNTER — Telehealth: Payer: Self-pay | Admitting: Pharmacy Technician

## 2022-06-18 ENCOUNTER — Other Ambulatory Visit (HOSPITAL_COMMUNITY): Payer: Self-pay

## 2022-06-18 NOTE — Telephone Encounter (Signed)
Patient Advocate Encounter  Received notification that prior authorization for XIFAXAN 550MG  is required.   PA submitted on 8.30.23 Key [id number] Status is pending    02-05-1990, CPhT Patient Advocate Phone: [phone]

## 2022-06-19 NOTE — Telephone Encounter (Signed)
Patient Advocate Encounter  Prior Authorization for Xifaxan 550MG  has been approved.   Effective: 06/18/2022 to 06/19/2023  06/21/2023, CPhT Rx Patient Advocate Phone: [phone]

## 2022-07-03 DIAGNOSIS — Z794 Long term (current) use of insulin: Secondary | ICD-10-CM | POA: Diagnosis not present

## 2022-07-03 DIAGNOSIS — I10 Essential (primary) hypertension: Secondary | ICD-10-CM | POA: Diagnosis not present

## 2022-07-03 DIAGNOSIS — E11319 Type 2 diabetes mellitus with unspecified diabetic retinopathy without macular edema: Secondary | ICD-10-CM | POA: Diagnosis not present

## 2022-07-03 DIAGNOSIS — E782 Mixed hyperlipidemia: Secondary | ICD-10-CM | POA: Diagnosis not present

## 2022-07-17 NOTE — Progress Notes (Signed)
07/18/2022 [place] [id number] 1956-08-26  Referring provider: Donnajean Lopes, MD Primary GI doctor: Dr. Henrene Pastor  ASSESSMENT AND PLAN:   Assessment: 66 y.o. female here for assessment of the following: 1. Loose stools   2. Abdominal bloating    History of gastric bypass 2013, s/p cholecystectomy 2014 Normal EGD/colon 01/2022 Unremarkable CT AB and pelvis for AB 11/2021 Started in Jan after 2 rounds of ABX for dental work Treated for [employer] in Feb without testing, negative Cdiff.  Zepep did not help symptoms Bloating, loose stools, urgency improved  Plan: Stopped olmesartan, and had some improvement. Testing positive for SIBO, treated with xifaxin and has had improvement Having 1 BM a day, no pain/gas.  Continue on IBGARD, FODMAP given.  If reoccurs could do trial of metronidazole.   No orders of the defined types were placed in this encounter.  Follow up 6 weeks  History of Present Illness:  66 y.o. female  with a past medical history of hypertension, diabetes, chronic diastolic heart failure (TTE 01/15/2022 showed EF 73%, elevated left ventricular EDP, normal RV mild LAE mild MR) vitamin D deficiency, dyslipidemia, history of gastric bypass 2013 and cholecystectomy 2014 and others listed below, returns to clinic today to discuss  02/04/2010 colonoscopy with Dr. Henrene Pastor showed diverticulosis sigmoid no polyps. 12/13/2021 CT abdomen pelvis with contrast no acute abdominal pelvis finding shows moderate prominence of left renal pelvis and mild associated left hydronephrosis obstruction versus chronic UPJ obstruction no renal stones, postoperative changes gastroesophageal junction and stomach. Had subsequent renal imaging with Dr. 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 02/04/2022 colonoscopy endoscopy with Dr. 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  Colonoscopy showed Suprep via split  instruction, excellent prep.  Unremarkable colonoscopy recall 10 years 03/05/2022 OV with Dr. Henrene Pastor, stool studies negative, blood work unremarkable. No microscopic colitis. Did trial of metronidazole that was slightly helpful for gas but as soon as she quit it, the symptoms returned.   Zenpep did not help, states it made it worse.    Negative C. difficile Patient stopped olmesartan for possible enteropathy had some improvement with this.  Also had positive SIBO, sent in [place]. Got on IBgard and FODMAP diet.  S/P Cholecystectomy 2014.  Wt Readings from Last 3 Encounters:  07/18/22 143 lb (64.9 kg)  05/27/22 143 lb (64.9 kg)  03/05/22 146 lb 8 oz (66.5 kg)     Current Medications:   Current Outpatient Medications (Endocrine & Metabolic):    insulin glargine (LANTUS SOLOSTAR) 100 UNIT/ML Solostar Pen, Lantus Solostar U-100 Insulin 100 unit/mL (3 mL) subcutaneous pen  INJECT 16 UNITS UNDER THE SKIN ONCE DAILY   insulin lispro (HUMALOG) 100 UNIT/ML KwikPen, Inject into the skin. Per sliding scale with meals   levothyroxine (SYNTHROID) 100 MCG tablet, 100 mcg daily.  Current Outpatient Medications (Cardiovascular):    amLODipine (NORVASC) 10 MG tablet, Take 1 tablet (10 mg total) by mouth daily.   furosemide (LASIX) 20 MG tablet, Take 1 tablet (20 mg total) by mouth daily. Take 1 tablet (20 mg total) by mouth twice daily for 2 days only, then decrease to taking 1 tablet (20 mg total) by mouth daily thereafter.   olmesartan (BENICAR) 40 MG tablet, 40 mg daily.   pravastatin (PRAVACHOL) 40 MG tablet, Take 40 mg by mouth every morning.    spironolactone (ALDACTONE) 25 MG tablet, Take 1 tablet (25 mg total) by mouth daily.  Current Outpatient Medications (Respiratory):  albuterol (VENTOLIN HFA) 108 (90 Base) MCG/ACT inhaler, Inhale 2 puffs into the lungs every 6 (six) hours as needed for wheezing or shortness of breath.    HYDROMET 5-1.5 MG/5ML syrup, Take 5 mLs by mouth at bedtime as  needed.   Current Outpatient Medications (Hematological):    Cyanocobalamin (B-12) 2500 MCG TABS, Take 2,500 mcg by mouth every Wednesday.   ferrous sulfate 325 (65 FE) MG EC tablet, Take 325 mg by mouth daily at 2 PM.   Current Outpatient Medications (Other):    Biotin 5000 MCG CAPS, Take 5,000 mcg by mouth daily.   Calcium Carb-Cholecalciferol 500-400 MG-UNIT TABS, Take 1 tablet by mouth at bedtime.   Cholecalciferol (VITAMIN D3) 5000 units CAPS, Take 10,000 Units by mouth daily at 2 PM.   dicyclomine (BENTYL) 10 MG capsule, Take 1 capsule (10 mg total) by mouth 2 (two) times daily as needed for spasms (abdominal pain).   gabapentin (NEURONTIN) 300 MG capsule, 300 mg as needed.   Multiple Vitamin (MULTIVITAMIN) tablet, Take 4 tablets by mouth daily at 2 PM.    ONE TOUCH ULTRA TEST test strip,    ONETOUCH DELICA LANCETS FINE MISC,    potassium chloride SA (KLOR-CON M) 20 MEQ tablet, Take 1 tablet (20 mEq total) by mouth daily.  Surgical History:  She  has a past surgical history that includes Gastric bypass (08/2012); Carpal tunnel release (Bilateral, 1988); Knee arthroscopy (Right, early 2000); Rotator cuff repair (Right, 2000); Achilles tendon repair (Left, mid 2000s); Colonoscopy (02/04/2010); Wisdom tooth extraction; Total knee arthroplasty (Right, 04/05/2018); Cataract extraction w/ intraocular lens  implant, bilateral (10/ 2018;  2014 approx.); Tonsillectomy (child); Knee Closed Reduction (Right, 07/05/2018); and Cholecystectomy. Family History:  Her family history includes Cancer in her mother; Diabetes in her father, maternal grandfather, and paternal grandfather; Heart attack in her father; Heart disease in her father; Hyperlipidemia in her brother and father; Hypertension in her father; Other in her father. Social History:   reports that she has never smoked. She has never used smokeless tobacco. She reports current alcohol use. She reports that she does not use drugs.  Current  Medications, Allergies, Past Medical History, Past Surgical History, Family History and Social History were reviewed in [employer] record.  Physical Exam: BP 130/62   Pulse (!) 54   Ht 5' 1.25" (1.556 m)   Wt 143 lb (64.9 kg)   BMI 26.80 kg/m  General:   Pleasant, well developed female in no acute distress Heart : Regular rate and rhythm; no murmurs Pulm: Clear anteriorly; no wheezing Abdomen:  Soft, Obese AB, Active bowel sounds. No tenderness . Without guarding and Without rebound, No organomegaly appreciated. Rectal: Not evaluated Extremities:  without  edema. Neurologic:  Alert and  oriented x4;  No focal deficits.  Psych:  Cooperative. Normal mood and affect.   Doree Albee, PA-C 07/18/22

## 2022-07-18 ENCOUNTER — Ambulatory Visit (INDEPENDENT_AMBULATORY_CARE_PROVIDER_SITE_OTHER): Payer: Medicare Other | Admitting: Physician Assistant

## 2022-07-18 ENCOUNTER — Encounter: Payer: Self-pay | Admitting: Physician Assistant

## 2022-07-18 VITALS — BP 130/62 | HR 54 | Ht 61.25 in | Wt 143.0 lb

## 2022-07-18 DIAGNOSIS — R195 Other fecal abnormalities: Secondary | ICD-10-CM | POA: Diagnosis not present

## 2022-07-18 DIAGNOSIS — R140 Abdominal distension (gaseous): Secondary | ICD-10-CM | POA: Diagnosis not present

## 2022-07-18 NOTE — Patient Instructions (Addendum)
_______________________________________________________  If you are age 68 or older, your body mass index should be between 23-30. Your Body mass index is 26.8 kg/m. If this is out of the aforementioned range listed, please consider follow up with your Primary Care Provider.  If you are age 28 or younger, your body mass index should be between 19-25. Your Body mass index is 26.8 kg/m. If this is out of the aformentioned range listed, please consider follow up with your Primary Care Provider.   ________________________________________________________  The [facility] providers would like to encourage you to use [facility] to communicate with providers for non-urgent requests or questions.  Due to long hold times on the telephone, sending your provider a message by [facility] may be a faster and more efficient way to get a response.  Please allow 48 business hours for a response.  Please remember that this is for non-urgent requests.  _______________________________________________________  Follow up as needed.   Can check with PCP about valsartan or another ARB that is NOT olmesartan Can be an ARB, just not the olmesartan due to risk of enteropathy with it.    FODMAP stands for fermentable oligo-, di-, mono-saccharides and polyols (1). These are the scientific terms used to classify groups of carbs that are notorious for triggering digestive symptoms like bloating, gas and stomach pain.

## 2022-07-18 NOTE — Progress Notes (Signed)
Assessment and plan noted ?

## 2022-07-28 DIAGNOSIS — L570 Actinic keratosis: Secondary | ICD-10-CM | POA: Diagnosis not present

## 2022-07-28 DIAGNOSIS — L814 Other melanin hyperpigmentation: Secondary | ICD-10-CM | POA: Diagnosis not present

## 2022-07-28 DIAGNOSIS — L821 Other seborrheic keratosis: Secondary | ICD-10-CM | POA: Diagnosis not present

## 2022-07-28 DIAGNOSIS — C44311 Basal cell carcinoma of skin of nose: Secondary | ICD-10-CM | POA: Diagnosis not present

## 2022-08-06 ENCOUNTER — Other Ambulatory Visit: Payer: Self-pay

## 2022-08-06 DIAGNOSIS — R600 Localized edema: Secondary | ICD-10-CM

## 2022-08-06 DIAGNOSIS — R011 Cardiac murmur, unspecified: Secondary | ICD-10-CM

## 2022-08-06 DIAGNOSIS — Z79899 Other long term (current) drug therapy: Secondary | ICD-10-CM

## 2022-08-06 MED ORDER — SPIRONOLACTONE 25 MG PO TABS: 25.0000 mg | ORAL_TABLET | Freq: Every day | ORAL | 2 refills | Status: DC

## 2022-08-06 MED ORDER — AMLODIPINE BESYLATE 10 MG PO TABS: 10.0000 mg | ORAL_TABLET | Freq: Every day | ORAL | 2 refills | Status: DC

## 2022-08-06 NOTE — Addendum Note (Signed)
Addended by: Carter Kitten D on: 08/06/2022 11:25 AM   Modules accepted: Orders

## 2022-08-26 NOTE — Progress Notes (Unsigned)
Cardiology Office Note:    Date:  08/26/2022   ID:  Shilpa, Bushee 10/12/1956, MRN [id number]  PCP:  Garlan Fillers, MD   [facility] Providers Cardiologist:  None {    Referring MD: Garlan Fillers, MD    History of Present Illness:    Rebecca Camacho is a 66 y.o. female with a hx of DMII, HTN, HLD, obesity s/p gastric bypass surgery, chronic venous insufficiency, and hypothyroidism who presents to clinic for follow-up.  Was last seen in clinic on 01/07/22 where she was suffering with elevated blood pressures and LE edema. We were concerned for possible HFrEF. BNP was normal. TTE 01/15/22 showed LVEF 73%, GLS -22.8%, elevated LVEDP, normal RV, mild LAE, mild MR.  She was placed on lasix and her antihypertensives were increased. She was also undergoing work-up for possible congenital abnormality in renal blood flow. NM renal scan showed prompt symmetric arterial flow to both kidneys. 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  Was last seen in clinic on 02/2022 where she was doing well from a CV standpoint.  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   CHOLECYSTECTOMY     2014   COLONOSCOPY  02/04/2010   Dr.Perry   GASTRIC BYPASS  08/2012   KNEE  ARTHROSCOPY Right early 2000   AND  RIGHT ACHILLES TENDON REPAIR    KNEE CLOSED REDUCTION Right 07/05/2018   Procedure: CLOSED MANIPULATION RIGHT KNEE;  Surgeon: Ollen Gross, MD;  Location: [facility];  Service: Orthopedics;  Laterality: Right;    ROTATOR CUFF REPAIR Right 2000   TONSILLECTOMY  child   TOTAL KNEE ARTHROPLASTY Right 04/05/2018   Procedure: RIGHT TOTAL KNEE ARTHROPLASTY;  Surgeon: Ollen Gross, MD;  Location: [facility];  Service: Orthopedics;  Laterality: Right;   WISDOM TOOTH EXTRACTION      Current Medications: No outpatient medications have been marked as taking for the 08/28/22 encounter (Appointment) with Meriam Sprague, MD.     Allergies:   Patient has no known allergies.   Social History   Socioeconomic History   Marital status: Married    Spouse name: Not on file   Number of children: 3   Years of education: Not on file   Highest education level: Not on file  Occupational History   Occupation: retired  Tobacco Use   Smoking status: Never   Smokeless tobacco: Never  Vaping Use   Vaping Use: Never used  Substance and Sexual Activity   Alcohol use: Yes    Comment: occasional   Drug use: No   Sexual activity: Not on file  Other Topics Concern   Not on file  Social History Narrative   Not on file  Social Determinants of Health   Financial Resource Strain: Not on file  Food Insecurity: Not on file  Transportation Needs: Not on file  Physical Activity: Not on file  Stress: Not on file  Social Connections: Not on file     Family History: The patient's family history includes Cancer in her mother; Diabetes in her father, maternal grandfather, and paternal grandfather; Heart attack in her father; Heart disease in her father; Hyperlipidemia in her brother and father; Hypertension in her father; Other in her father. There is no history of Colon cancer, Rectal cancer, Stomach cancer, Esophageal cancer, Pancreatic cancer, or Lung cancer.  ROS:    Please see the history of present illness.    Review of Systems  Constitutional:  Positive for malaise/fatigue. Negative for fever.  HENT:  Negative for congestion and sore throat.   Eyes:  Negative for blurred vision.  Respiratory:  Positive for shortness of breath. Negative for cough.   Cardiovascular:  Negative for chest pain, palpitations, orthopnea, claudication, leg swelling and PND.  Gastrointestinal:  Negative for heartburn and nausea.  Genitourinary:  Positive for dysuria and frequency.  Musculoskeletal:  Negative for joint pain and myalgias.  Skin:  Negative for itching and rash.  Neurological:  Negative for dizziness and headaches.  Endo/Heme/Allergies:  Positive for environmental allergies. Does not bruise/bleed easily.  Psychiatric/Behavioral:  The patient is not nervous/anxious and does not have insomnia.    All other systems reviewed and are negative.  EKGs/Labs/Other Studies Reviewed:    The following studies were reviewed today:  TTE 12/2021: IMPRESSIONS   1. Left ventricular ejection fraction by 3D volume is 73 %. The left  ventricle has hyperdynamic function. The left ventricle has no regional  wall motion abnormalities. Left ventricular diastolic function could not  be evaluated. Elevated left ventricular   end-diastolic pressure. The average left ventricular global longitudinal  strain is -22.8 %. The global longitudinal strain is normal.   2. Right ventricular systolic function is normal. The right ventricular  size is normal.   3. Left atrial size was mildly dilated.   4. The mitral valve is degenerative. Mild mitral valve regurgitation. No  evidence of mitral stenosis. Moderate mitral annular calcification.   5. The aortic valve is normal in structure. Aortic valve regurgitation is  not visualized. Aortic valve sclerosis/calcification is present, without  any evidence of aortic stenosis.   6. The inferior vena cava is normal in size with greater than  50%  respiratory variability, suggesting right atrial pressure of 3 mmHg.   CT Abdomen 12/13/21: IMPRESSION: 1. No acute findings in the abdomen/pelvis. 2. Moderate prominence of the left renal pelvis and mild associated left hydronephrosis. Findings may be due to acute mild degree of obstruction versus chronic UPJ obstruction. No renal/ureteral stones visualized. 3. Postsurgical changes over the gastroesophageal junction and stomach. 4. Aortic atherosclerosis.   Aortic Atherosclerosis (ICD10-I70.0).  EKG:  EKG is personally reviewed. 02/21/2022:  EKG was not ordered. 01/07/22: Sinus bradycardia, rate 56 bpm; LVH  Recent Labs: 12/09/2021: Hemoglobin 12.5; Platelets 237.0 01/15/2022: NT-Pro BNP 258 02/21/2022: BUN 12; Creatinine, Ser 0.86; Potassium 4.1; Sodium 139   Recent Lipid Panel No results found for: "CHOL", "TRIG", "HDL", "CHOLHDL", "VLDL", "LDLCALC", "LDLDIRECT"   Risk Assessment/Calculations:           Physical Exam:    VS:  There were no vitals taken for this visit.    Wt Readings from Last 3 Encounters:  07/18/22 143 lb (64.9 kg)  05/27/22 143  lb (64.9 kg)  03/05/22 146 lb 8 oz (66.5 kg)     GEN: Well nourished, well developed in no acute distress HEENT: Normal NECK: No JVD; No carotid bruits CARDIAC: Bradycardic, 2/6 early peaking systolic murmur at the RUSB, no rubs or gallops RESPIRATORY:  Clear to auscultation without rales, wheezing or rhonchi ABDOMEN: Soft, non-tender, non-distended MUSCULOSKELETAL: Trace LE edema; No deformity; varicose veins SKIN: Warm and dry NEUROLOGIC:  Alert and oriented x 3 PSYCHIATRIC:  Normal affect   ASSESSMENT:    No diagnosis found.   PLAN:    In order of problems listed above:  #Chronic HFpEF Patient presented with worsening LE edema and severely elevated blood pressures on initial visit. TTE with normal BiV function, elevated LVEDP, normal GLS, mild MR, no other significant valve disease. BNP was normal, however,  given elevated LVEDP and volume overload, suspect diastiolic dysfunction was contributing to her clinical picture. Currently, doing much better. Euvolemic on exam with NYHA class I-II symptoms.  -Continue lasix 20mg  as needed -Continue K supplementation with each dose of lasix -Continue amlodipine 10mg  daily -Continue spironolactone 25mg  daily -Continue olmesartan 40mg  daily -Low Na diet -Monitor daily weights and call if gaining >3lbs in 1-2 days or >5lbs in 3-5 days  #HTN: Well controlled with goal 120/80s.  -Continue amlodipine 10mg  daily -Continue spironolactone 25mg  daily -Continue olmesartan 40mg  daily  #Mild MR: TTE 12/2021 with mild MR. -Continue serial monitoring every 3-5 years  #HLD: -Continue pravastatin 40mg  daily  #DMII: -On insulin  Follow-up:  6 months..    Medication Adjustments/Labs and Tests Ordered: Current medicines are reviewed at length with the patient today.  Concerns regarding medicines are outlined above.   No orders of the defined types were placed in this encounter.  No orders of the defined types were placed in this encounter.  There are no Patient Instructions on file for this visit.     Signed, Freada Bergeron, MD  08/26/2022 8:39 PM    [place]

## 2022-08-28 ENCOUNTER — Encounter: Payer: Self-pay | Admitting: Cardiology

## 2022-08-28 ENCOUNTER — Ambulatory Visit: Payer: Medicare Other | Attending: Cardiology | Admitting: Cardiology

## 2022-08-28 VITALS — BP 132/72 | HR 53 | Ht 61.25 in | Wt 142.0 lb

## 2022-08-28 DIAGNOSIS — E7800 Pure hypercholesterolemia, unspecified: Secondary | ICD-10-CM

## 2022-08-28 DIAGNOSIS — I83813 Varicose veins of bilateral lower extremities with pain: Secondary | ICD-10-CM | POA: Diagnosis not present

## 2022-08-28 DIAGNOSIS — E119 Type 2 diabetes mellitus without complications: Secondary | ICD-10-CM

## 2022-08-28 DIAGNOSIS — I5032 Chronic diastolic (congestive) heart failure: Secondary | ICD-10-CM

## 2022-08-28 DIAGNOSIS — I10 Essential (primary) hypertension: Secondary | ICD-10-CM | POA: Diagnosis not present

## 2022-08-28 DIAGNOSIS — R600 Localized edema: Secondary | ICD-10-CM

## 2022-08-28 NOTE — Patient Instructions (Signed)
Medication Instructions:   Your physician recommends that you continue on your current medications as directed. Please refer to the Current Medication list given to you today.  *If you need a refill on your cardiac medications before your next appointment, please call your pharmacy*   You have been referred to [facility] ([facility]) FOR VARICOSE VEINS    Follow-Up: At [facility], you and your health needs are our priority.  As part of our continuing mission to provide you with exceptional heart care, we have created designated Provider Care Teams.  These Care Teams include your primary Cardiologist (physician) and Advanced Practice Providers (APPs -  Physician Assistants and Nurse Practitioners) who all work together to provide you with the care you need, when you need it.  We recommend signing up for the patient portal called "MyChart".  Sign up information is provided on this After Visit Summary.  MyChart is used to connect with patients for Virtual Visits (Telemedicine).  Patients are able to view lab/test results, encounter notes, upcoming appointments, etc.  Non-urgent messages can be sent to your provider as well.   To learn more about what you can do with MyChart, go to [email or web address].    Your next appointment:   1 year(s)  The format for your next appointment:   In Person  Provider:   DR. Shari Prows   Important Information About Sugar

## 2022-08-28 NOTE — Progress Notes (Signed)
Cardiology Office Note:    Date:  08/28/2022   ID:  Rebecca Camacho, Rebecca Camacho Oct 07, 1956, MRN [id number]  PCP:  Donnajean Lopes, MD   [facility] Providers Cardiologist:  None {   Referring MD: Donnajean Lopes, MD    History of Present Illness:    Rebecca Camacho is a 66 y.o. female with a hx of DMII, HTN, HLD, obesity s/p gastric bypass surgery, chronic venous insufficiency, and hypothyroidism who presents to clinic for follow-up.  Was last seen in clinic on 01/07/22 where she was suffering with elevated blood pressures and LE edema. We were concerned for possible HFrEF. BNP was normal. TTE 01/15/22 showed LVEF 73%, GLS -22.8%, elevated LVEDP, normal RV, mild LAE, mild MR.  She was placed on lasix and her antihypertensives were increased. She was also undergoing work-up for possible congenital abnormality in renal blood flow. NM renal scan showed prompt symmetric arterial flow to both kidneys. 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  Was last seen in clinic on 02/2022 where she was doing well from a CV standpoint.  Today, the patient states that she is feeling good. She has recovered from her past GI distress including SIBO with no recurring issues.  One of her main complaints is bilateral LE varicose veins. This has been bothersome for her and he symptoms have progressed despite compression sock therapy. She continues to have LE pain, swelling and itching. She has been compliant with her lasix which has helped control the swelling. We discussed at length recommendations for possible treatment.  Today her blood pressure is 132/72  in clinic. She notes that she no longer takes olmesartan.  Since her last visit she has lost about 15 lbs. She attributes this to her prior GI health issues.  She denies any palpitations, chest pain, shortness of breath, or peripheral edema. No  lightheadedness, headaches, syncope, orthopnea, or PND.  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   CHOLECYSTECTOMY     2014   COLONOSCOPY  02/04/2010   Dr.Perry   GASTRIC BYPASS  08/2012   KNEE ARTHROSCOPY Right early 2000   AND  RIGHT ACHILLES TENDON REPAIR    KNEE CLOSED REDUCTION Right 07/05/2018   Procedure: CLOSED MANIPULATION RIGHT KNEE;  Surgeon: Gaynelle Arabian, MD;  Location: [facility];  Service: Orthopedics;  Laterality: Right;  28min   ROTATOR CUFF REPAIR Right 2000   TONSILLECTOMY  child   TOTAL KNEE ARTHROPLASTY Right 04/05/2018   Procedure: RIGHT TOTAL KNEE ARTHROPLASTY;  Surgeon: Gaynelle Arabian, MD;  Location: [facility];  Service: Orthopedics;  Laterality: Right;   WISDOM TOOTH EXTRACTION      Current Medications: Current Meds  Medication Sig   albuterol (VENTOLIN HFA) 108 (90 Base)  MCG/ACT inhaler Inhale 2 puffs into the lungs every 6 (six) hours as needed for wheezing or shortness of breath.    amLODipine (NORVASC) 10 MG tablet Take 1 tablet (10 mg total) by mouth daily.   Biotin 5000 MCG CAPS Take 5,000 mcg by mouth daily.   Calcium Carb-Cholecalciferol 500-400 MG-UNIT TABS Take 1 tablet by mouth at bedtime.   Cholecalciferol (VITAMIN D3) 5000 units CAPS Take 10,000 Units by mouth daily at 2 PM.   Cyanocobalamin (B-12) 2500 MCG TABS Take 2,500 mcg by mouth every  Wednesday.   ferrous sulfate 325 (65 FE) MG EC tablet Take 325 mg by mouth daily at 2 PM.    furosemide (LASIX) 20 MG tablet Take 1 tablet (20 mg total) by mouth daily. Take 1 tablet (20 mg total) by mouth twice daily for 2 days only, then decrease to taking 1 tablet (20 mg total) by mouth daily thereafter.   gabapentin (NEURONTIN) 300 MG capsule 300 mg as needed.   HYDROMET 5-1.5 MG/5ML syrup Take 5 mLs by mouth at bedtime as needed.   insulin glargine (LANTUS SOLOSTAR) 100 UNIT/ML Solostar Pen Lantus Solostar U-100 Insulin 100 unit/mL (3 mL) subcutaneous pen  INJECT 16 UNITS UNDER THE SKIN ONCE DAILY   insulin lispro (HUMALOG) 100 UNIT/ML KwikPen Inject into the skin. Per sliding scale with meals   levothyroxine (SYNTHROID) 100 MCG tablet 100 mcg daily.   Multiple Vitamin (MULTIVITAMIN) tablet Take 4 tablets by mouth daily at 2 PM.    ONE TOUCH ULTRA TEST test strip    ONETOUCH DELICA LANCETS FINE MISC    potassium chloride SA (KLOR-CON M) 20 MEQ tablet Take 1 tablet (20 mEq total) by mouth daily.   pravastatin (PRAVACHOL) 40 MG tablet Take 40 mg by mouth every morning.    spironolactone (ALDACTONE) 25 MG tablet Take 1 tablet (25 mg total) by mouth daily.     Allergies:   Patient has no known allergies.   Social History   Socioeconomic History   Marital status: Married    Spouse name: Not on file   Number of children: 3   Years of education: Not on file   Highest education level: Not on file  Occupational History   Occupation: retired  Tobacco Use   Smoking status: Never   Smokeless tobacco: Never  Vaping Use   Vaping Use: Never used  Substance and Sexual Activity   Alcohol use: Yes    Comment: occasional   Drug use: No   Sexual activity: Not on file  Other Topics Concern   Not on file  Social History Narrative   Not on file   Social Determinants of Health   Financial Resource Strain: Not on file  Food Insecurity: Not on file  Transportation Needs: Not on file   Physical Activity: Not on file  Stress: Not on file  Social Connections: Not on file     Family History: The patient's family history includes Cancer in her mother; Diabetes in her father, maternal grandfather, and paternal grandfather; Heart attack in her father; Heart disease in her father; Hyperlipidemia in her brother and father; Hypertension in her father; Other in her father. There is no history of Colon cancer, Rectal cancer, Stomach cancer, Esophageal cancer, Pancreatic cancer, or Lung cancer.  ROS:   Please see the history of present illness.    Review of Systems  Constitutional:  Positive for malaise/fatigue. Negative for fever.  HENT:  Negative for congestion and sore throat.   Eyes:  Negative for blurred vision.  Respiratory:  Negative for cough.   Cardiovascular:  Negative for chest pain, palpitations, orthopnea, claudication, leg swelling and PND.  Gastrointestinal:  Negative for heartburn and nausea.  Genitourinary:  Positive for frequency.  Musculoskeletal:  Negative for joint pain and myalgias.  Skin:  Negative for itching and rash.  Neurological:  Positive for tingling (Posterior shins). Negative for dizziness and headaches.  Endo/Heme/Allergies:  Positive for environmental allergies. Does not bruise/bleed easily.  Psychiatric/Behavioral:  The patient is not nervous/anxious and does not have insomnia.    All other systems reviewed and are negative.  EKGs/Labs/Other Studies Reviewed:    The following studies were reviewed today:  TTE 12/2021: IMPRESSIONS   1. Left ventricular ejection fraction by 3D volume is 73 %. The left  ventricle has hyperdynamic function. The left ventricle has no regional  wall motion abnormalities. Left ventricular diastolic function could not  be evaluated. Elevated left ventricular   end-diastolic pressure. The average left ventricular global longitudinal  strain is -22.8 %. The global longitudinal strain is normal.   2. Right  ventricular systolic function is normal. The right ventricular  size is normal.   3. Left atrial size was mildly dilated.   4. The mitral valve is degenerative. Mild mitral valve regurgitation. No  evidence of mitral stenosis. Moderate mitral annular calcification.   5. The aortic valve is normal in structure. Aortic valve regurgitation is  not visualized. Aortic valve sclerosis/calcification is present, without  any evidence of aortic stenosis.   6. The inferior vena cava is normal in size with greater than 50%  respiratory variability, suggesting right atrial pressure of 3 mmHg.   CT Abdomen 12/13/21: IMPRESSION: 1. No acute findings in the abdomen/pelvis. 2. Moderate prominence of the left renal pelvis and mild associated left hydronephrosis. Findings may be due to acute mild degree of obstruction versus chronic UPJ obstruction. No renal/ureteral stones visualized. 3. Postsurgical changes over the gastroesophageal junction and stomach. 4. Aortic atherosclerosis.   Aortic Atherosclerosis (ICD10-I70.0).  EKG:  EKG is personally reviewed. 08/28/2022: EKG was not ordered. 02/21/2022:  EKG was not ordered. 01/07/22: Sinus bradycardia, rate 56 bpm; LVH  Recent Labs: 12/09/2021: Hemoglobin 12.5; Platelets 237.0 01/15/2022: NT-Pro BNP 258 02/21/2022: BUN 12; Creatinine, Ser 0.86; Potassium 4.1; Sodium 139   Recent Lipid Panel No results found for: "CHOL", "TRIG", "HDL", "CHOLHDL", "VLDL", "LDLCALC", "LDLDIRECT"   Risk Assessment/Calculations:          Physical Exam:    VS:  BP 132/72   Pulse (!) 53   Ht 5' 1.25" (1.556 m)   Wt 142 lb (64.4 kg)   SpO2 97%   BMI 26.61 kg/m     Wt Readings from Last 3 Encounters:  08/28/22 142 lb (64.4 kg)  07/18/22 143 lb (64.9 kg)  05/27/22 143 lb (64.9 kg)     GEN: Well nourished, well developed in no acute distress HEENT: Normal NECK: No JVD; No carotid bruits CARDIAC: Bradycardic, 2/6 early peaking systolic murmur at the RUSB, no rubs or  gallops RESPIRATORY:  Clear to auscultation without rales, wheezing or rhonchi ABDOMEN: Soft, non-tender, non-distended MUSCULOSKELETAL: Trace LE edema; prominent varicose veins SKIN: Warm and dry NEUROLOGIC:  Alert and oriented x 3 PSYCHIATRIC:  Normal affect   ASSESSMENT:    1. Chronic diastolic heart failure (HCC)   2. Varicose veins of both lower extremities with pain   3. Diabetes mellitus with coincident hypertension (HCC)   4. Primary hypertension   5. Pure  hypercholesterolemia   6. Bilateral lower extremity edema     PLAN:    In order of problems listed above:  #Chronic HFpEF TTE with normal BiV function, elevated LVEDP, normal GLS, mild MR, no other significant valve disease. Currently, doing much better. Euvolemic on exam with NYHA class I-II symptoms.  -Continue lasix 20mg  daily -Continue K supplementation with each dose of lasix -Continue amlodipine 10mg  daily -Continue spironolactone 25mg  daily -Low Na diet -Monitor daily weights and call if gaining >3lbs in 1-2 days or >5lbs in 3-5 days  #HTN: Well controlled with goal 120/80s at home -Continue amlodipine 10mg  daily -Continue spironolactone 25mg  daily  #Mild MR: TTE 12/2021 with mild MR. -Continue serial monitoring every 3-5 years  #HLD: -Continue pravastatin 40mg  daily -LDL well controlled at 78 (no known CAD) -Monitored by PCP  #DMII: -On insulin  #Varicose Veins: Has painful varicosities with swelling. Symptoms have persisted despite >52months of compression sock therapy. Requesting referral to vein and vascular.   Follow-up:  1 year.    Medication Adjustments/Labs and Tests Ordered: Current medicines are reviewed at length with the patient today.  Concerns regarding medicines are outlined above.   Orders Placed This Encounter  Procedures   Ambulatory referral to Vascular Surgery   No orders of the defined types were placed in this encounter.  Patient Instructions  Medication  Instructions:   Your physician recommends that you continue on your current medications as directed. Please refer to the Current Medication list given to you today.  *If you need a refill on your cardiac medications before your next appointment, please call your pharmacy*   You have been referred to [facility] ([facility]) FOR VARICOSE VEINS    Follow-Up: At [facility], you and your health needs are our priority.  As part of our continuing mission to provide you with exceptional heart care, we have created designated Provider Care Teams.  These Care Teams include your primary Cardiologist (physician) and Advanced Practice Providers (APPs -  Physician Assistants and Nurse Practitioners) who all work together to provide you with the care you need, when you need it.  We recommend signing up for the patient portal called "MyChart".  Sign up information is provided on this After Visit Summary.  MyChart is used to connect with patients for Virtual Visits (Telemedicine).  Patients are able to view lab/test results, encounter notes, upcoming appointments, etc.  Non-urgent messages can be sent to your provider as well.   To learn more about what you can do with MyChart, go to [email or web address].    Your next appointment:   1 year(s)  The format for your next appointment:   In Person  Provider:   DR. Johney Frame   Important Information About Sugar        I,Mathew Stumpf,acting as a scribe for Freada Bergeron, MD.,have documented all relevant documentation on the behalf of Freada Bergeron, MD,as directed by  Freada Bergeron, MD while in the presence of Freada Bergeron, MD.  I, Freada Bergeron, MD, have reviewed all documentation for this visit. The documentation on 08/28/22 for the exam, diagnosis, procedures, and orders are all accurate and complete.    Signed, Freada Bergeron, MD  08/28/2022 10:42 AM    [place]

## 2022-09-02 DIAGNOSIS — E11319 Type 2 diabetes mellitus with unspecified diabetic retinopathy without macular edema: Secondary | ICD-10-CM | POA: Diagnosis not present

## 2022-09-02 DIAGNOSIS — I10 Essential (primary) hypertension: Secondary | ICD-10-CM | POA: Diagnosis not present

## 2022-09-02 DIAGNOSIS — E039 Hypothyroidism, unspecified: Secondary | ICD-10-CM | POA: Diagnosis not present

## 2022-09-02 DIAGNOSIS — I700 Atherosclerosis of aorta: Secondary | ICD-10-CM | POA: Diagnosis not present

## 2022-10-07 ENCOUNTER — Other Ambulatory Visit: Payer: Self-pay | Admitting: *Deleted

## 2022-10-07 DIAGNOSIS — M79604 Pain in right leg: Secondary | ICD-10-CM

## 2022-10-27 DIAGNOSIS — C44321 Squamous cell carcinoma of skin of nose: Secondary | ICD-10-CM | POA: Diagnosis not present

## 2022-11-05 DIAGNOSIS — I10 Essential (primary) hypertension: Secondary | ICD-10-CM | POA: Diagnosis not present

## 2022-11-05 DIAGNOSIS — E11319 Type 2 diabetes mellitus with unspecified diabetic retinopathy without macular edema: Secondary | ICD-10-CM | POA: Diagnosis not present

## 2022-11-06 DIAGNOSIS — Z1231 Encounter for screening mammogram for malignant neoplasm of breast: Secondary | ICD-10-CM | POA: Diagnosis not present

## 2022-11-21 ENCOUNTER — Ambulatory Visit (HOSPITAL_COMMUNITY)
Admission: RE | Admit: 2022-11-21 | Discharge: 2022-11-21 | Disposition: A | Discharge status: Home / Self Care | Payer: Medicare Other | Source: Ambulatory Visit | Attending: Vascular Surgery | Admitting: Vascular Surgery

## 2022-11-21 ENCOUNTER — Other Ambulatory Visit (HOSPITAL_COMMUNITY): Payer: Self-pay | Admitting: Urology

## 2022-11-21 ENCOUNTER — Ambulatory Visit: Payer: Medicare Other | Admitting: Vascular Surgery

## 2022-11-21 ENCOUNTER — Encounter: Payer: Self-pay | Admitting: Vascular Surgery

## 2022-11-21 VITALS — BP 155/77 | HR 52 | Temp 97.9°F | Resp 20 | Ht 61.0 in | Wt 143.0 lb

## 2022-11-21 DIAGNOSIS — N1330 Unspecified hydronephrosis: Secondary | ICD-10-CM

## 2022-11-21 DIAGNOSIS — I872 Venous insufficiency (chronic) (peripheral): Secondary | ICD-10-CM | POA: Diagnosis not present

## 2022-11-21 DIAGNOSIS — M79604 Pain in right leg: Secondary | ICD-10-CM | POA: Diagnosis not present

## 2022-11-21 DIAGNOSIS — M79605 Pain in left leg: Secondary | ICD-10-CM | POA: Diagnosis not present

## 2022-11-21 NOTE — Progress Notes (Signed)
Originally from [place], moved to [place], where she spent the majority of her life, mother of 3, worked at [employer], and that her husband who live in [place] for the last 2 years.  Remains active, water aerobics, left travel, RV.  Right leg, varicosities, pain at the knee, difficult with range of motion by end of day   Office Note     CC: Right lower extremity swelling, knee stiffness, loss of range of motion Requesting Provider:  Donnajean Lopes, MD  HPI: Rebecca Camacho is a 67 y.o. (Apr 17, 1956) female who presents at the request of Donnajean Lopes, MD for evaluation of chronic venous insufficiency.  Of note, she has been seen in our office several times over the last 10 years with known chronic venous insufficiency.  At her last visit 5 years ago, she was offered radiofrequency ablation, stab phlebectomy, but was lost to follow-up.  In 2019, Rebecca Camacho underwent right knee replacement.  Since that time, she has had waxing and waning swelling of the knee with loss of range of motion that she appreciates most by days end.  She notes some right lower extremity swelling that extends into her calf but stops at her ankle.  She has significant varicosities, notes some swelling and pain, but no bleeding or ulceration.  Now retired, she continues to live an active lifestyle with her husband of two years.  She is in the pool several times a week.  She worked at [employer] for over 20 years, and still enjoys her coffee.  He has a family history, and stated her mom underwent saphenous vein stripping, and stab phlebectomy. No previous DVT, and no previous vein procedures. She has worn compression stockings for years on and off, but has been compliant for the last 2 months with little improvement.  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   CHOLECYSTECTOMY     2014   COLONOSCOPY  02/04/2010   Dr.Perry   GASTRIC BYPASS  08/2012   KNEE ARTHROSCOPY Right early 2000   AND  RIGHT ACHILLES TENDON REPAIR    KNEE CLOSED REDUCTION Right 07/05/2018   Procedure: CLOSED MANIPULATION RIGHT KNEE;  Surgeon: Gaynelle Arabian, MD;  Location: [facility];  Service: Orthopedics;  Laterality: Right;  73min   ROTATOR CUFF REPAIR Right 2000   TONSILLECTOMY  child   TOTAL KNEE ARTHROPLASTY Right 04/05/2018   Procedure: RIGHT TOTAL KNEE ARTHROPLASTY;  Surgeon: Gaynelle Arabian, MD;  Location: [facility];  Service: Orthopedics;  Laterality: Right;   WISDOM TOOTH EXTRACTION      Social History   Socioeconomic History   Marital status: Married    Spouse name: Not on file   Number of children: 3   Years of education: Not on file   Highest education level: Not  on file  Occupational History   Occupation: retired  Tobacco Use   Smoking status: Never   Smokeless tobacco: Never  Vaping Use   Vaping Use: Never used  Substance and Sexual Activity   Alcohol use: Yes    Comment: occasional   Drug use: No   Sexual activity: Not on file  Other Topics Concern   Not on file  Social History Narrative   Not on file   Social Determinants of Health   Financial Resource Strain: Not on file  Food Insecurity: Not on file  Transportation Needs: Not on file  Physical Activity: Not on file  Stress: Not on file  Social Connections: Not on file  Intimate Partner Violence: Not on file   Family  History  Problem Relation Age of Onset   Cancer Mother        lung   Diabetes Father    Heart disease Father    Hyperlipidemia Father    Hypertension Father    Heart attack Father    Other Father        varicose veins   Hyperlipidemia Brother    Diabetes Maternal Grandfather    Diabetes Paternal Grandfather    Colon cancer Neg Hx    Rectal cancer Neg Hx    Stomach cancer Neg Hx    Esophageal cancer Neg Hx    Pancreatic cancer Neg Hx    Lung cancer Neg Hx     Current Outpatient Medications  Medication Sig Dispense Refill   albuterol (VENTOLIN HFA) 108 (90 Base) MCG/ACT inhaler Inhale 2 puffs into the lungs every 6 (six) hours as needed for wheezing or shortness of breath.      amLODipine (NORVASC) 10 MG tablet Take 1 tablet (10 mg total) by mouth daily. 90 tablet 2   Biotin 5000 MCG CAPS Take 5,000 mcg by mouth daily.     Calcium Carb-Cholecalciferol 500-400 MG-UNIT TABS Take 1 tablet by mouth at bedtime.     Cholecalciferol (VITAMIN D3) 5000 units CAPS Take 10,000 Units by mouth daily at 2 PM.     Cyanocobalamin (B-12) 2500 MCG TABS Take 2,500 mcg by mouth every Wednesday.     ferrous sulfate 325 (65 FE) MG EC tablet Take 325 mg by mouth daily at 2 PM.      furosemide (LASIX) 20 MG tablet Take 1 tablet (20 mg total) by mouth daily. Take 1 tablet (20 mg total) by mouth twice daily for 2 days only, then decrease to taking 1 tablet (20 mg total) by mouth daily thereafter. 90 tablet 3   gabapentin (NEURONTIN) 300 MG capsule 300 mg as needed.     HYDROMET 5-1.5 MG/5ML syrup Take 5 mLs by mouth at bedtime as needed.     insulin glargine (LANTUS SOLOSTAR) 100 UNIT/ML Solostar Pen Lantus Solostar U-100 Insulin 100 unit/mL (3 mL) subcutaneous pen  INJECT 16 UNITS UNDER THE SKIN ONCE DAILY     insulin lispro (HUMALOG) 100 UNIT/ML KwikPen Inject into the skin. Per sliding scale with meals     levothyroxine (SYNTHROID) 100 MCG tablet 100 mcg daily.     Multiple Vitamin (MULTIVITAMIN) tablet  Take 4 tablets by mouth daily at 2 PM.      olmesartan (BENICAR) 40 MG tablet 40 mg daily.     ONE TOUCH ULTRA TEST test strip      ONETOUCH DELICA LANCETS FINE MISC      potassium chloride SA (KLOR-CON M) 20 MEQ tablet Take 1 tablet (20  mEq total) by mouth daily. 90 tablet 3   pravastatin (PRAVACHOL) 40 MG tablet Take 40 mg by mouth every morning.      spironolactone (ALDACTONE) 25 MG tablet Take 1 tablet (25 mg total) by mouth daily. 90 tablet 2   No current facility-administered medications for this visit.    No Known Allergies   REVIEW OF SYSTEMS:  [X]  denotes positive finding, [ ]  denotes negative finding Cardiac  Comments:  Chest pain or chest pressure:    Shortness of breath upon exertion:    Short of breath when lying flat:    Irregular heart rhythm:        Vascular    Pain in calf, thigh, or hip brought on by ambulation:    Pain in feet at night that wakes you up from your sleep:     Blood clot in your veins:    Leg swelling:         Pulmonary    Oxygen at home:    Productive cough:     Wheezing:         Neurologic    Sudden weakness in arms or legs:     Sudden numbness in arms or legs:     Sudden onset of difficulty speaking or slurred speech:    Temporary loss of vision in one eye:     Problems with dizziness:         Gastrointestinal    Blood in stool:     Vomited blood:         Genitourinary    Burning when urinating:     Blood in urine:        Psychiatric    Major depression:         Hematologic    Bleeding problems:    Problems with blood clotting too easily:        Skin    Rashes or ulcers:        Constitutional    Fever or chills:      PHYSICAL EXAMINATION:  Vitals:   11/21/22 1025  BP: (!) 155/77  Pulse: (!) 52  Resp: 20  Temp: 97.9 F (36.6 C)  SpO2: 98%  Weight: 143 lb (64.9 kg)  Height: 5\' 1"  (1.549 m)    General:  WDWN in NAD; vital signs documented above Gait: Not observed HENT: WNL, normocephalic Pulmonary: normal  non-labored breathing , without Rales, rhonchi,  wheezing Cardiac: regular HR Abdomen: soft, NT, no masses Skin: without rashes Vascular Exam/Pulses:  Right Left  Radial 2+ (normal) 2+ (normal)  Ulnar 2+ (normal) 2+ (normal)  Femoral    Popliteal    DP 2+ (normal) 2+ (normal)  PT 2+ (normal) 2+ (normal)   Extremities: without ischemic changes, without Gangrene , without cellulitis; without open wounds;  No asymmetric swelling or edema, multiple varicose veins bilaterally, right worse than left. Musculoskeletal: no muscle wasting or atrophy  Neurologic: A&O X 3;  No focal weakness or paresthesias are detected Psychiatric:  The pt has Normal affect.   Non-Invasive Vascular Imaging:   Minimal deep reflux, superficial reflux appreciated in the greater saphenous vein, anterior accessory vein    ASSESSMENT/PLAN:: 67 y.o. female presenting with asymmetric right lower extremity swelling which is worse by days end.  She notes the swelling extends from her knee into her calf, and stops at the ankle.  Accompanying the swelling is loss of range of motion in the knee, which is her main complaint.  This has  been a problem since her knee replacement in 2019.  I had a long discussion with Rebecca Camacho regarding the above.  Interestingly, the knee swelling which limits her range of motion began at the same time as her knee replacement.  Rebecca Camacho likely has multiple etiologies for swelling around the knee joint, as well as in the calf.  I think the localized swelling in the knee joint with fullness in the popliteal fossa is less likely from chronic venous insufficiency and more likely from postsurgical changes.  She is a candidate for greater saphenous vein ablation, and stab phlebectomy, but my main concern is that she understood that ablating the vein, and moving varicosities may have very little impact on the range of motion at the right knee.  I do think it will help with the calf edema.  My plan is to have her  follow-up with my partner Dr. Scot Dock, who performs venous ablation, stab phlebectomy in the office.  I will schedule her in the coming month, as she is already worn compression stockings for the last 2.  I asked that she call my office should any questions or concerns arise.  Broadus John, MD [facility] [phone]

## 2022-12-24 ENCOUNTER — Ambulatory Visit: Payer: Medicare Other | Admitting: Vascular Surgery

## 2022-12-31 ENCOUNTER — Encounter: Payer: Self-pay | Admitting: Vascular Surgery

## 2022-12-31 ENCOUNTER — Ambulatory Visit: Payer: Medicare Other | Admitting: Vascular Surgery

## 2022-12-31 VITALS — BP 150/72 | HR 61 | Temp 97.7°F | Resp 14

## 2022-12-31 DIAGNOSIS — I83813 Varicose veins of bilateral lower extremities with pain: Secondary | ICD-10-CM

## 2022-12-31 DIAGNOSIS — I872 Venous insufficiency (chronic) (peripheral): Secondary | ICD-10-CM | POA: Diagnosis not present

## 2022-12-31 NOTE — Progress Notes (Signed)
REASON FOR VISIT:   Follow-up of chronic venous insufficiency and painful varicose veins.  MEDICAL ISSUES:   CHRONIC VENOUS INSUFFICIENCY AND PAINFUL VARICOSE VEINS OF THE RIGHT LOWER EXTREMITY: This patient has painful varicose veins of the right lower extremity and problems with leg swelling.  We have again discussed the importance of daily leg elevation and the proper positioning for this.  I have encouraged her to avoid prolonged sitting and standing.  We discussed importance of exercise specifically walking and water aerobics.  She does swim frequently.  She does have a healthy weight.  We also discussed continued use of her compression stockings.  Given that she is having persistent symptoms I think she would be a candidate for laser ablation of the right anterior accessory saphenous vein with greater than 20 stabs.  She does have reflux in the right great saphenous vein also but on my exam today this vein was not as dilated.  The anterior sensory appeared to be the main culprit.  However she continued to have symptoms we would have to consider staged appellation of the right great saphenous vein.  I have discussed the indications for endovenous laser ablation of the right AASV, that is to lower the pressure in the veins and potentially help relieve the symptoms from venous hypertension.  I have also discussed alternative options such as conservative treatment as described above. I have discussed the potential complications of the procedure, including bleeding, bruising, leg swelling, deep venous thrombosis (<1% risk), or failure of the vein to close <1% risk).  I have also explained that venous insufficiency is a chronic disease, and that the patient is at risk for recurrent varicose veins in the future.  All of the patient's questions were encouraged and answered. They are agreeable to proceed.   I have discussed with the patient the indications for stab phlebectomy.  I have explained to  the patient that that will have small scars from the stab incisions.  I explained that the other risks include bruising, bleeding, and phlebitis.  She has an enormous number of varicosities and I have explained that we will be trying to decompress these in general but certainly would not be able to remove every varicosity.   HPI:   Rebecca Camacho is a pleasant 67 y.o. female who was seen by Dr. Virl Cagey on 11/21/2022 with swelling and pain in the right lower extremity.  She has a history of chronic venous insufficiency.  I had actually seen her back in 2019 and at that time we were considering laser ablation of both lower extremities but she was then lost to follow-up.  The patient has been wearing her thigh-high compression stockings with a gradient of 20 to 30 mmHg since September of last year.  They do help her symptoms some.  She is continuing to have significant symptoms.  She is very active and exercises and swims quite a bit.  She has a healthy weight.  She has been elevating her legs which helps some too.  However she continued to have significant symptoms.  She has aching pain and heaviness in the right leg associated with sitting and standing.  She is also having problems with swelling in the right leg.  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   Type 2 diabetes mellitus treated with insulin (HCC)    Varicose veins of both lower extremities     Family History  Problem Relation Age of Onset   Cancer Mother        lung   Diabetes Father    Heart disease Father    Hyperlipidemia Father    Hypertension Father    Heart attack Father    Other Father        varicose veins   Hyperlipidemia Brother     Diabetes Maternal Grandfather    Diabetes Paternal Grandfather    Colon cancer Neg Hx    Rectal cancer Neg Hx    Stomach cancer Neg Hx    Esophageal cancer Neg Hx    Pancreatic cancer Neg Hx    Lung cancer Neg Hx     SOCIAL HISTORY: Social History   Tobacco Use   Smoking status: Never   Smokeless tobacco: Never  Substance Use Topics   Alcohol use: Yes    Comment: occasional    No Known Allergies  Current Outpatient Medications  Medication Sig Dispense Refill   albuterol (VENTOLIN HFA) 108 (90 Base) MCG/ACT inhaler Inhale 2 puffs into the lungs every 6 (six) hours as needed for wheezing or shortness of breath.      amLODipine (NORVASC) 10 MG tablet Take 1 tablet (10 mg total) by mouth daily. 90 tablet 2   Biotin 5000 MCG CAPS Take 5,000 mcg by mouth daily.     Calcium Carb-Cholecalciferol 500-400 MG-UNIT TABS Take 1 tablet by mouth at bedtime.     Cholecalciferol (VITAMIN D3) 5000 units CAPS Take 10,000 Units by mouth daily at 2 PM.     Cyanocobalamin (B-12) 2500 MCG TABS Take 2,500 mcg by mouth every Wednesday.     ferrous sulfate 325 (65 FE) MG EC tablet Take 325 mg by mouth daily at 2 PM.      furosemide (LASIX) 20 MG tablet Take 1 tablet (20 mg total) by mouth daily. Take 1 tablet (20 mg total) by mouth twice daily for 2 days only, then decrease to taking 1 tablet (20 mg total) by mouth daily thereafter. 90 tablet 3   gabapentin (NEURONTIN) 300 MG capsule 300 mg as needed.     HYDROMET 5-1.5 MG/5ML syrup Take 5 mLs by mouth at bedtime as needed.     insulin glargine (LANTUS SOLOSTAR) 100 UNIT/ML Solostar Pen Lantus Solostar U-100 Insulin 100 unit/mL (3 mL) subcutaneous pen  INJECT 16 UNITS UNDER THE SKIN ONCE DAILY     insulin lispro (HUMALOG) 100 UNIT/ML KwikPen Inject into the skin. Per sliding scale with meals     levothyroxine (SYNTHROID) 100 MCG tablet 100 mcg daily.     Multiple Vitamin (MULTIVITAMIN) tablet Take 4 tablets by mouth daily at 2 PM.      olmesartan  (BENICAR) 40 MG tablet 40 mg daily.     ONE TOUCH ULTRA TEST test strip      ONETOUCH DELICA LANCETS FINE MISC      potassium chloride SA (KLOR-CON M) 20 MEQ tablet Take 1 tablet (20 mEq total) by mouth daily. 90 tablet 3   pravastatin (PRAVACHOL) 40 MG tablet Take 40 mg by mouth every morning.      spironolactone (ALDACTONE) 25 MG tablet Take 1 tablet (25 mg total) by mouth daily. 90 tablet 2  No current facility-administered medications for this visit.    REVIEW OF SYSTEMS:  '[X]'$  denotes positive finding, '[ ]'$  denotes negative finding Cardiac  Comments:  Chest pain or chest pressure:    Shortness of breath upon exertion:    Short of breath when lying flat:    Irregular heart rhythm:        Vascular    Pain in calf, thigh, or hip brought on by ambulation:    Pain in feet at night that wakes you up from your sleep:     Blood clot in your veins:    Leg swelling:         Pulmonary    Oxygen at home:    Productive cough:     Wheezing:         Neurologic    Sudden weakness in arms or legs:     Sudden numbness in arms or legs:     Sudden onset of difficulty speaking or slurred speech:    Temporary loss of vision in one eye:     Problems with dizziness:         Gastrointestinal    Blood in stool:     Vomited blood:         Genitourinary    Burning when urinating:     Blood in urine:        Psychiatric    Major depression:         Hematologic    Bleeding problems:    Problems with blood clotting too easily:        Skin    Rashes or ulcers:        Constitutional    Fever or chills:     PHYSICAL EXAM:   Vitals:   12/31/22 1457  BP: (!) 150/72  Pulse: 61  Resp: 14  Temp: 97.7 F (36.5 C)  TempSrc: Temporal  SpO2: 99%   GENERAL: The patient is a well-nourished female, in no acute distress. The vital signs are documented above. CARDIAC: There is a regular rate and rhythm.  VASCULAR: I do not detect carotid bruits. She has palpable pedal pulses. She has many  varicose veins of the right leg as documented in the photographs below.       I did look at her great saphenous vein and anterior accessory saphenous vein myself with the SonoSite.  The anterior accessory saphenous vein appears to be the vein that spitting many of her varicosities along the anterolateral aspect of her right thigh.  The great saphenous vein also has reflux but is not as dilated in the thigh. PULMONARY: There is good air exchange bilaterally without wheezing or rales. ABDOMEN: Soft and non-tender with normal pitched bowel sounds.  MUSCULOSKELETAL: There are no major deformities or cyanosis. NEUROLOGIC: No focal weakness or paresthesias are detected. SKIN: There are no ulcers or rashes noted. PSYCHIATRIC: The patient has a normal affect.  DATA:    VENOUS DUPLEX: I have reviewed the venous duplex scan that was done on 11/21/2022.  This was of the right lower extremity only.  There was no evidence of DVT.  There was deep venous reflux involving the common femoral vein only.  There was superficial venous reflux in the right great saphenous vein from the saphenofemoral junction to the mid thigh.  Diameters of the vein ranged from 4.3-9.4 mm.  There is also reflux in the anterior accessory saphenous vein which was dilated up to 6.3 mm.  The results of the  study are summarized in the diagram below.    A total of 42 minutes was spent on this visit. 21 minutes was face to face time. More than 50% of the time was spent on counseling and coordinating with the patient.    Deitra Mayo [facility] [phone]

## 2023-01-05 DIAGNOSIS — I700 Atherosclerosis of aorta: Secondary | ICD-10-CM | POA: Diagnosis not present

## 2023-01-05 DIAGNOSIS — E11319 Type 2 diabetes mellitus with unspecified diabetic retinopathy without macular edema: Secondary | ICD-10-CM | POA: Diagnosis not present

## 2023-01-05 DIAGNOSIS — E1165 Type 2 diabetes mellitus with hyperglycemia: Secondary | ICD-10-CM | POA: Diagnosis not present

## 2023-01-05 DIAGNOSIS — R052 Subacute cough: Secondary | ICD-10-CM | POA: Diagnosis not present

## 2023-01-05 DIAGNOSIS — I10 Essential (primary) hypertension: Secondary | ICD-10-CM | POA: Diagnosis not present

## 2023-01-06 ENCOUNTER — Encounter (HOSPITAL_COMMUNITY)
Admission: RE | Admit: 2023-01-06 | Discharge: 2023-01-06 | Disposition: A | Discharge status: Home / Self Care | Payer: Medicare Other | Source: Ambulatory Visit | Attending: Urology | Admitting: Urology

## 2023-01-06 DIAGNOSIS — N1330 Unspecified hydronephrosis: Secondary | ICD-10-CM

## 2023-01-06 MED ORDER — FUROSEMIDE 10 MG/ML IJ SOLN
INTRAMUSCULAR | Status: AC
  Filled 2023-01-06: qty 4

## 2023-01-06 MED ORDER — FUROSEMIDE 10 MG/ML IJ SOLN: 32.5000 mg | Freq: Once | INTRAMUSCULAR | Status: DC

## 2023-01-06 MED ORDER — TECHNETIUM TC 99M MERTIATIDE
5.0000 | Freq: Once | INTRAVENOUS | Status: AC
  Administered 2023-01-06: 5 via INTRAVENOUS

## 2023-01-07 ENCOUNTER — Other Ambulatory Visit: Payer: Self-pay | Admitting: *Deleted

## 2023-01-07 DIAGNOSIS — I83811 Varicose veins of right lower extremities with pain: Secondary | ICD-10-CM

## 2023-02-13 ENCOUNTER — Other Ambulatory Visit: Payer: Self-pay | Admitting: *Deleted

## 2023-02-13 ENCOUNTER — Telehealth: Payer: Self-pay | Admitting: Cardiology

## 2023-02-13 DIAGNOSIS — R011 Cardiac murmur, unspecified: Secondary | ICD-10-CM

## 2023-02-13 DIAGNOSIS — Z79899 Other long term (current) drug therapy: Secondary | ICD-10-CM

## 2023-02-13 DIAGNOSIS — R600 Localized edema: Secondary | ICD-10-CM

## 2023-02-13 MED ORDER — POTASSIUM CHLORIDE CRYS ER 20 MEQ PO TBCR: 20.0000 meq | EXTENDED_RELEASE_TABLET | Freq: Every day | ORAL | 3 refills | Status: AC

## 2023-02-13 MED ORDER — FUROSEMIDE 20 MG PO TABS: 20.0000 mg | ORAL_TABLET | Freq: Every day | ORAL | 3 refills | Status: DC

## 2023-02-13 NOTE — Telephone Encounter (Signed)
Spoke with pharmacy to clarify furosemide dose order.

## 2023-02-13 NOTE — Telephone Encounter (Signed)
Pt c/o medication issue:  1. Name of Medication:   furosemide (LASIX) 20 MG tablet   2. How are you currently taking this medication (dosage and times per day)?   3. Are you having a reaction (difficulty breathing--STAT)?   4. What is your medication issue?    Caller stated they need to verify the directions for this medication.

## 2023-02-18 ENCOUNTER — Other Ambulatory Visit: Payer: Self-pay | Admitting: *Deleted

## 2023-02-18 DIAGNOSIS — Z79899 Other long term (current) drug therapy: Secondary | ICD-10-CM

## 2023-02-18 DIAGNOSIS — R011 Cardiac murmur, unspecified: Secondary | ICD-10-CM

## 2023-02-18 DIAGNOSIS — R600 Localized edema: Secondary | ICD-10-CM

## 2023-02-18 MED ORDER — FUROSEMIDE 20 MG PO TABS: 20.0000 mg | ORAL_TABLET | Freq: Every day | ORAL | 3 refills | Status: DC

## 2023-02-26 DIAGNOSIS — H00022 Hordeolum internum right lower eyelid: Secondary | ICD-10-CM | POA: Diagnosis not present

## 2023-03-04 ENCOUNTER — Other Ambulatory Visit: Payer: Self-pay | Admitting: *Deleted

## 2023-03-04 MED ORDER — LORAZEPAM 1 MG PO TABS: ORAL_TABLET | ORAL | 0 refills | Status: DC

## 2023-03-11 ENCOUNTER — Ambulatory Visit: Payer: Medicare Other | Admitting: Vascular Surgery

## 2023-03-11 ENCOUNTER — Encounter: Payer: Self-pay | Admitting: Vascular Surgery

## 2023-03-11 VITALS — BP 142/68 | HR 62 | Temp 98.0°F | Resp 16 | Ht 64.0 in | Wt 145.0 lb

## 2023-03-11 DIAGNOSIS — I83811 Varicose veins of right lower extremities with pain: Secondary | ICD-10-CM | POA: Diagnosis not present

## 2023-03-11 DIAGNOSIS — I872 Venous insufficiency (chronic) (peripheral): Secondary | ICD-10-CM

## 2023-03-11 HISTORY — PX: ENDOVENOUS ABLATION SAPHENOUS VEIN W/ LASER: SUR449

## 2023-03-11 NOTE — Progress Notes (Signed)
     Laser Ablation Procedure    Date: 03/11/2023   Rebecca Camacho DOB:November 10, 1955  Consent signed: Yes      Surgeon: Cari Caraway MD   Procedure: Laser Ablation: right  Anterior   Saphenous Vein  BP (!) 142/68 (BP Location: Left Arm, Patient Position: Sitting, Cuff Size: Normal)   Pulse 62   Temp 98 F (36.7 C) (Temporal)   Resp 16   Ht 5\' 4"  (1.626 m)   Wt 145 lb (65.8 kg)   SpO2 99%   BMI 24.89 kg/m   Tumescent Anesthesia: 600 cc 0.9% NaCl with 50 cc Lidocaine HCL 1%  and 15 cc 8.4% NaHCO3  Local Anesthesia: 10 cc Lidocaine HCL and NaHCO3 (ratio 2:1)  7 watts continuous mode     Total energy: 495 Joules     Total time: 70 seconds  Treatment Length  11 cm   Laser Fiber Ref. #     [id number]    Lot #  [id number]   Stab Phlebectomy: >20  Sites: Thigh and Calf  Patient tolerated procedure well  Notes:   All staff members wore facial masks.  Ativan 1 mg (1 tablet ) taken on 03-11-2023 at 7:30 AM and at 8:43 AM.    Description of Procedure:  After marking the course of the secondary varicosities, the patient was placed on the operating table in the supine position, and the right leg was prepped and draped in sterile fashion.   Local anesthetic was administered and under ultrasound guidance the saphenous vein was accessed with a micro needle and guide wire; then the mirco puncture sheath was placed.  A guide wire was inserted saphenofemoral junction , followed by a 5 french sheath.  The position of the sheath and then the laser fiber below the junction was confirmed using the ultrasound.  Tumescent anesthesia was administered along the course of the saphenous vein using ultrasound guidance. The patient was placed in Trendelenburg position and protective laser glasses were placed on patient and staff, and the laser was fired at 7 watts continuous mode for a total of 495 joules.   For stab phlebectomies, local anesthetic was administered at the previously marked varicosities,  and tumescent anesthesia was administered around the vessels.  Greater than 20 stab wounds were made using the tip of an 11 blade. And using the vein hook, the phlebectomies were performed using a hemostat to avulse the varicosities.  Adequate hemostasis was achieved.     Steri strips were applied to the stab wounds and ABD pads and thigh high compression stockings were applied.  Ace wrap bandages were applied over the phlebectomy sites and at the top of the saphenofemoral junction. Blood loss was less than 15 cc.  Discharge instructions reviewed with patient and hardcopy of discharge instructions given to patient to take home. The patient was taken out of the operating room via wheelchair to her car having tolerated the procedure well.

## 2023-03-11 NOTE — Progress Notes (Signed)
Patient name: Rebecca Camacho MRN: [id number] DOB: 1956/02/22 Sex: female  REASON FOR VISIT: For endovenous laser ablation of the right anterior accessory saphenous vein and greater than 20 stabs.  HPI: Rebecca Camacho is a 67 y.o. female who I last saw on 12/31/2022.  She had chronic venous insufficiency and painful varicose veins of the right lower extremity.  She failed conservative treatment and I felt that she would be a good candidate for laser ablation of the right anterior accessory saphenous vein with greater than 20 stabs.  She also had some reflux in the right great saphenous vein but on my exam this vein was fairly small.  The anterior accessory saphenous vein appeared to be what was feeding these large varicose veins that were symptomatic.  If she continued to have problems we could the right great saphenous vein.  Current Outpatient Medications  Medication Sig Dispense Refill   LORazepam (ATIVAN) 1 MG tablet Take 1 tablet 30 minutes prior to leaving house on day of office surgery. Bring second tablet with you to office on day of office surgery. 2 tablet 0   albuterol (VENTOLIN HFA) 108 (90 Base) MCG/ACT inhaler Inhale 2 puffs into the lungs every 6 (six) hours as needed for wheezing or shortness of breath.      amLODipine (NORVASC) 10 MG tablet Take 1 tablet (10 mg total) by mouth daily. 90 tablet 2   Biotin 5000 MCG CAPS Take 5,000 mcg by mouth daily.     Calcium Carb-Cholecalciferol 500-400 MG-UNIT TABS Take 1 tablet by mouth at bedtime.     Cholecalciferol (VITAMIN D3) 5000 units CAPS Take 10,000 Units by mouth daily at 2 PM.     Cyanocobalamin (B-12) 2500 MCG TABS Take 2,500 mcg by mouth every Wednesday.     ferrous sulfate 325 (65 FE) MG EC tablet Take 325 mg by mouth daily at 2 PM.      furosemide (LASIX) 20 MG tablet Take 1 tablet (20 mg total) by mouth daily. 90 tablet 3   gabapentin (NEURONTIN) 300 MG capsule 300 mg as needed.     HYDROMET 5-1.5 MG/5ML syrup Take 5  mLs by mouth at bedtime as needed.     insulin glargine (LANTUS SOLOSTAR) 100 UNIT/ML Solostar Pen Lantus Solostar U-100 Insulin 100 unit/mL (3 mL) subcutaneous pen  INJECT 16 UNITS UNDER THE SKIN ONCE DAILY     insulin lispro (HUMALOG) 100 UNIT/ML KwikPen Inject into the skin. Per sliding scale with meals     levothyroxine (SYNTHROID) 100 MCG tablet 100 mcg daily.     Multiple Vitamin (MULTIVITAMIN) tablet Take 4 tablets by mouth daily at 2 PM.      olmesartan (BENICAR) 40 MG tablet 40 mg daily.     ONE TOUCH ULTRA TEST test strip      ONETOUCH DELICA LANCETS FINE MISC      potassium chloride SA (KLOR-CON M) 20 MEQ tablet Take 1 tablet (20 mEq total) by mouth daily. 90 tablet 3   pravastatin (PRAVACHOL) 40 MG tablet Take 40 mg by mouth every morning.  (Patient not taking: Reported on 03/11/2023)     spironolactone (ALDACTONE) 25 MG tablet Take 1 tablet (25 mg total) by mouth daily. 90 tablet 2   No current facility-administered medications for this visit.    PHYSICAL EXAM: Vitals:   03/11/23 0832  BP: (!) 142/68  Pulse: 62  Resp: 16  Temp: 98 F (36.7 C)  TempSrc: Temporal  SpO2: 99%  Weight: 145  lb (65.8 kg)  Height: 5\' 4"  (1.626 m)    PROCEDURE: Laser ablation of the right anterior accessory saphenous vein and greater than 20 stabs  TECHNIQUE: The patient was brought to the exam room and the dilated varicose veins were marked with the patient standing.  The patient was then placed supine.  I assessed the great saphenous and anterior accessory saphenous on the right with the SonoSite.  Again it looks like the anterior accessory saphenous vein was feeding the varicosities in the thigh as the great saphenous vein quickly tapered in size.  The right leg was prepped and draped in usual sterile fashion.  Under ultrasound guidance, after the skin was anesthetized, I cannulated the anterior sensory saphenous vein in the mid thigh with a micropuncture needle and a micropuncture sheath was  introduced over a wire.  I then advanced the J-wire to just below the saphenofemoral junction.  The 45 cm sheath was advanced over the wire and the wire and dilator removed.  The laser fiber was positioned at the end of the sheath and the sheath retracted.  I position the laser fiber 2.5 cm distal to the saphenofemoral junction.  Tumescent anesthesia was then administered circumferentially around the vein.  I then performed laser ablation of the right anterior accessory saphenous vein from 2-1/2 cm distal to the saphenofemoral junction to the junction of the proximal and mid thigh.  Approximately 12 cm of vein was treated.  Next attention was turned to stab phlebectomies.  All the marked areas were anesthetized with tumescent anesthesia.  Using approximately 30 small stab incisions the veins were "brought above the skin and then bluntly excised.  Pressure was held for hemostasis.  Steri-Strips were applied.  A pressure dressing was applied.  The patient tolerated the procedure well and will return for a follow-up visit in 1 week.  Rebecca Camacho [facility] [place] [phone]

## 2023-03-23 ENCOUNTER — Telehealth: Payer: Self-pay

## 2023-03-23 NOTE — Telephone Encounter (Signed)
Spoke with pt regarding her pain over stab phlebectomy site. She is feeling some relief today, as she has been icing the area a little and put her compression hose back on. We discussed elevation of the leg and continuing with compression. She will f/u with MD this wed. No further questions/concerns at this time.

## 2023-03-23 NOTE — Telephone Encounter (Signed)
Pt called stating she had procedures done approx 2 wks ago for varicose veins. She is c/o a painful area on the side of her calf, bee-stinging sensation that began on Sat, and it's causing her issues ambulation.  Reviewed pt's chart, returned call for clarification, two identifiers used. Pt states on the lateral aspect of her R calf where a stab phlebectomy was done, it is slightly reddened, swollen, a dime-sized knot, and constant pain. She stopped wearing her compressions yesterday at 5 PM. She has taken OTC Tylenol and some Gabapentin. Instructed her to wear her compressions, try to ambulate more, take OTC meds, use ice, and monitor that area. Will have Harriett Sine, RN call her later today for more advice. Confirmed understanding.

## 2023-03-25 ENCOUNTER — Encounter: Payer: Self-pay | Admitting: Vascular Surgery

## 2023-03-25 ENCOUNTER — Ambulatory Visit (HOSPITAL_COMMUNITY)
Admission: RE | Admit: 2023-03-25 | Discharge: 2023-03-25 | Disposition: A | Discharge status: Home / Self Care | Payer: Medicare Other | Source: Ambulatory Visit | Attending: Vascular Surgery | Admitting: Vascular Surgery

## 2023-03-25 ENCOUNTER — Ambulatory Visit (INDEPENDENT_AMBULATORY_CARE_PROVIDER_SITE_OTHER): Payer: Medicare Other | Admitting: Vascular Surgery

## 2023-03-25 VITALS — BP 143/67 | HR 56 | Temp 97.7°F | Resp 16

## 2023-03-25 DIAGNOSIS — I872 Venous insufficiency (chronic) (peripheral): Secondary | ICD-10-CM

## 2023-03-25 DIAGNOSIS — I83811 Varicose veins of right lower extremities with pain: Secondary | ICD-10-CM | POA: Diagnosis not present

## 2023-03-25 NOTE — Progress Notes (Signed)
Patient name: Rebecca Camacho MRN: [id number] DOB: 05-Jul-1956 Sex: female  REASON FOR VISIT: Follow-up after laser ablation of the right anterior accessory saphenous vein and greater than 30 stabs.  HPI: Rebecca Camacho is a 67 y.o. female who presented with painful varicose veins of her right lower extremity.  She had failed conservative treatment.  She was felt to be a good candidate for laser ablation of the right anterior accessory saphenous vein and stab phlebectomies.  She comes in for 1 week follow-up visit.  Overall she is doing well.  She has 1 small stab incision that opened slightly and had some mild redness.  Current Outpatient Medications  Medication Sig Dispense Refill   albuterol (VENTOLIN HFA) 108 (90 Base) MCG/ACT inhaler Inhale 2 puffs into the lungs every 6 (six) hours as needed for wheezing or shortness of breath.      amLODipine (NORVASC) 10 MG tablet Take 1 tablet (10 mg total) by mouth daily. 90 tablet 2   Biotin 5000 MCG CAPS Take 5,000 mcg by mouth daily.     Calcium Carb-Cholecalciferol 500-400 MG-UNIT TABS Take 1 tablet by mouth at bedtime.     Cholecalciferol (VITAMIN D3) 5000 units CAPS Take 10,000 Units by mouth daily at 2 PM.     Cyanocobalamin (B-12) 2500 MCG TABS Take 2,500 mcg by mouth every Wednesday.     ferrous sulfate 325 (65 FE) MG EC tablet Take 325 mg by mouth daily at 2 PM.      furosemide (LASIX) 20 MG tablet Take 1 tablet (20 mg total) by mouth daily. 90 tablet 3   gabapentin (NEURONTIN) 300 MG capsule 300 mg as needed.     HYDROMET 5-1.5 MG/5ML syrup Take 5 mLs by mouth at bedtime as needed.     insulin lispro (HUMALOG) 100 UNIT/ML KwikPen Inject into the skin. Per sliding scale with meals     levothyroxine (SYNTHROID) 100 MCG tablet 100 mcg daily.     Multiple Vitamin (MULTIVITAMIN) tablet Take 4 tablets by mouth daily at 2 PM.      ONE TOUCH ULTRA TEST test strip      ONETOUCH DELICA LANCETS FINE MISC      potassium chloride SA  (KLOR-CON M) 20 MEQ tablet Take 1 tablet (20 mEq total) by mouth daily. 90 tablet 3   pravastatin (PRAVACHOL) 40 MG tablet Take 40 mg by mouth every morning.     REPATHA SURECLICK 140 MG/ML SOAJ Inject 140 mg into the skin every 14 (fourteen) days.     spironolactone (ALDACTONE) 25 MG tablet Take 1 tablet (25 mg total) by mouth daily. 90 tablet 2   TRESIBA FLEXTOUCH 100 UNIT/ML FlexTouch Pen Inject 15 Units into the skin daily.     insulin glargine (LANTUS SOLOSTAR) 100 UNIT/ML Solostar Pen  (Patient not taking: Reported on 03/25/2023)     LORazepam (ATIVAN) 1 MG tablet Take 1 tablet 30 minutes prior to leaving house on day of office surgery. Bring second tablet with you to office on day of office surgery. (Patient not taking: Reported on 03/25/2023) 2 tablet 0   olmesartan (BENICAR) 40 MG tablet 40 mg daily. (Patient not taking: Reported on 03/25/2023)     No current facility-administered medications for this visit.   REVIEW OF SYSTEMS: Arly.Keller ] denotes positive finding; [  ] denotes negative finding  CARDIOVASCULAR:  [ ]  chest pain   [ ]  dyspnea on exertion  [ ]  leg swelling  CONSTITUTIONAL:  [ ]  fever   [ ]   chills  PHYSICAL EXAM: Vitals:   03/25/23 1038  BP: (!) 143/67  Pulse: (!) 56  Resp: 16  Temp: 97.7 F (36.5 C)  TempSrc: Temporal  SpO2: 97%   GENERAL: The patient is a well-nourished female, in no acute distress. The vital signs are documented above. CARDIOVASCULAR: There is a regular rate and rhythm. PULMONARY: There is good air exchange bilaterally without wheezing or rales. VASCULAR: Her stab incisions are all healing well except for 1 small stab incision on her posterior calf where the wound is separated some.  There are some mild redness but no drainage.  DATA:  VENOUS DUPLEX: I have independently interpreted her venous duplex scan today.  There is no evidence of DVT in the right lower extremity.  The right anterior accessory saphenous vein is successfully closed from the mid  thigh to within 1.9 cm of the saphenofemoral junction.  MEDICAL ISSUES:  S/P LASER ABLATION RIGHT AASV AND STAB PHLEBECTOMIES: Patient is doing well status post laser ablation of the right anterior accessory saphenous vein and stab phlebectomies.  There is 1 small stab incision where the wound is separated some.  I instructed her to keep bacitracin and a Band-Aid over this until it heals.  Currently I do not think we need to address the right great saphenous vein is as it does not appear especially dilated.  She understands this may progress over time.  She would like to have her left leg evaluated so I will bring her back in 1 month for a formal venous reflux test on the left.  Waverly Ferrari [facility] [place] [phone]

## 2023-03-26 ENCOUNTER — Other Ambulatory Visit: Payer: Self-pay

## 2023-03-26 DIAGNOSIS — I872 Venous insufficiency (chronic) (peripheral): Secondary | ICD-10-CM

## 2023-04-16 DIAGNOSIS — K080 Exfoliation of teeth due to systemic causes: Secondary | ICD-10-CM | POA: Diagnosis not present

## 2023-04-16 DIAGNOSIS — R040 Epistaxis: Secondary | ICD-10-CM | POA: Diagnosis not present

## 2023-04-29 DIAGNOSIS — K080 Exfoliation of teeth due to systemic causes: Secondary | ICD-10-CM | POA: Diagnosis not present

## 2023-05-13 ENCOUNTER — Encounter: Payer: Self-pay | Admitting: Vascular Surgery

## 2023-05-13 ENCOUNTER — Ambulatory Visit: Payer: Medicare Other | Admitting: Vascular Surgery

## 2023-05-13 ENCOUNTER — Ambulatory Visit (HOSPITAL_COMMUNITY)
Admission: RE | Admit: 2023-05-13 | Discharge: 2023-05-13 | Disposition: A | Discharge status: Home / Self Care | Payer: Medicare Other | Source: Ambulatory Visit | Attending: Vascular Surgery | Admitting: Vascular Surgery

## 2023-05-13 VITALS — BP 120/76 | HR 63 | Temp 97.7°F | Resp 14

## 2023-05-13 DIAGNOSIS — I83812 Varicose veins of left lower extremities with pain: Secondary | ICD-10-CM | POA: Diagnosis not present

## 2023-05-13 DIAGNOSIS — I872 Venous insufficiency (chronic) (peripheral): Secondary | ICD-10-CM | POA: Insufficient documentation

## 2023-05-13 NOTE — Progress Notes (Signed)
REASON FOR VISIT:   Follow-up of chronic venous insufficiency  MEDICAL ISSUES:   CHRONIC VENOUS INSUFFICIENCY: This patient has C4 venous disease.  She has hyperpigmentation.  She is having significant symptoms of venous hypertension on the right and I think she would be a good candidate for laser ablation of the left great saphenous vein and greater than 20 stab phlebectomies.  She has failed conservative treatment including leg elevation and thigh-high compression stockings with a gradient of 20 to 30 mmHg.  As noted below access to the saphenous vein may be somewhat challenging.  I would likely access this in the mid thigh below the area of tortuosity but before the vein exits the fascia.  If we could not get through this area we would cannulate the vein higher.  I have discussed the indications for endovenous laser ablation of the left GSV, that is to lower the pressure in the veins and potentially help relieve the symptoms from venous hypertension.  I have also discussed alternative options such as conservative treatment as described above. I have discussed the potential complications of the procedure, including bleeding, bruising, leg swelling, deep venous thrombosis (<1% risk), or failure of the vein to close <1% risk).  I have also explained that venous insufficiency is a chronic disease, and that the patient is at risk for recurrent varicose veins in the future.  All of the patient's questions were encouraged and answered. They are agreeable to proceed.   I have discussed with the patient the indications for stab phlebectomy.  I have explained to the patient that that will have small scars from the stab incisions.  I explained that the other risks include bruising, bleeding, and phlebitis.    HPI:   [place] Rebecca Camacho is a pleasant 67 y.o. female who has undergone previous laser ablation of the right anterior accessory saphenous vein and multiple stab phlebectomies.  When I saw her  last she was doing well and her duplex scan showed no evidence of DVT.  The right anterior accessory saphenous vein was successfully closed.  She was having symptoms in her left leg and wished to come in to have her left leg evaluated.  Today, the patient is complaining of significant aching pain and heaviness in the left leg.  Her symptoms are aggravated by sitting and standing and relieved with elevation.  She has been wearing her thigh-high compression stockings.  Her symptoms are worse at the end of the day.  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   Type 2 diabetes mellitus treated with insulin (HCC)    Varicose veins of both lower extremities     Family History  Problem Relation Age of Onset   Cancer Mother        lung   Diabetes Father    Heart disease Father    Hyperlipidemia Father    Hypertension Father    Heart attack Father    Other Father        varicose veins   Hyperlipidemia Brother    Diabetes Maternal Grandfather  Diabetes Paternal Grandfather    Colon cancer Neg Hx    Rectal cancer Neg Hx    Stomach cancer Neg Hx    Esophageal cancer Neg Hx    Pancreatic cancer Neg Hx    Lung cancer Neg Hx     SOCIAL HISTORY: Social History   Tobacco Use   Smoking status: Never   Smokeless tobacco: Never  Substance Use Topics   Alcohol use: Yes    Comment: occasional    No Known Allergies  Current Outpatient Medications  Medication Sig Dispense Refill   albuterol (VENTOLIN HFA) 108 (90 Base) MCG/ACT inhaler Inhale 2 puffs into the lungs every 6 (six) hours as needed for wheezing or shortness of breath.       amLODipine (NORVASC) 10 MG tablet Take 1 tablet (10 mg total) by mouth daily. 90 tablet 2   Biotin 5000 MCG CAPS Take 5,000 mcg by mouth daily.     Calcium Carb-Cholecalciferol 500-400 MG-UNIT TABS Take 1 tablet by mouth at bedtime.     Cholecalciferol (VITAMIN D3) 5000 units CAPS Take 10,000 Units by mouth daily at 2 PM.     Cyanocobalamin (B-12) 2500 MCG TABS Take 2,500 mcg by mouth every Wednesday.     ferrous sulfate 325 (65 FE) MG EC tablet Take 325 mg by mouth daily at 2 PM.      furosemide (LASIX) 20 MG tablet Take 1 tablet (20 mg total) by mouth daily. 90 tablet 3   gabapentin (NEURONTIN) 300 MG capsule 300 mg as needed.     HYDROMET 5-1.5 MG/5ML syrup Take 5 mLs by mouth at bedtime as needed.     insulin lispro (HUMALOG) 100 UNIT/ML KwikPen Inject into the skin. Per sliding scale with meals     levothyroxine (SYNTHROID) 100 MCG tablet 100 mcg daily.     LORazepam (ATIVAN) 1 MG tablet Take 1 tablet 30 minutes prior to leaving house on day of office surgery. Bring second tablet with you to office on day of office surgery. 2 tablet 0   Multiple Vitamin (MULTIVITAMIN) tablet Take 4 tablets by mouth daily at 2 PM.      ONE TOUCH ULTRA TEST test strip      ONETOUCH DELICA LANCETS FINE MISC      potassium chloride SA (KLOR-CON M) 20 MEQ tablet Take 1 tablet (20 mEq total) by mouth daily. 90 tablet 3   REPATHA SURECLICK 140 MG/ML SOAJ Inject 140 mg into the skin every 14 (fourteen) days.     spironolactone (ALDACTONE) 25 MG tablet Take 1 tablet (25 mg total) by mouth daily. 90 tablet 2   TRESIBA FLEXTOUCH 100 UNIT/ML FlexTouch Pen Inject 15 Units into the skin daily.     insulin glargine (LANTUS SOLOSTAR) 100 UNIT/ML Solostar Pen  (Patient not taking: Reported on 03/25/2023)     olmesartan (BENICAR) 40 MG tablet 40 mg daily. (Patient not taking: Reported on 03/25/2023)     pravastatin (PRAVACHOL) 40 MG tablet Take 40 mg by mouth every morning.     No current facility-administered medications  for this visit.    REVIEW OF SYSTEMS:  [X]  denotes positive finding, [ ]  denotes negative finding Cardiac  Comments:  Chest pain or chest pressure:    Shortness of breath upon exertion:    Short of breath when lying flat:    Irregular heart rhythm:        Vascular    Pain in calf, thigh, or hip brought on by ambulation:  Pain in feet at night that wakes you up from your sleep:     Blood clot in your veins:    Leg swelling:         Pulmonary    Oxygen at home:    Productive cough:     Wheezing:         Neurologic    Sudden weakness in arms or legs:     Sudden numbness in arms or legs:     Sudden onset of difficulty speaking or slurred speech:    Temporary loss of vision in one eye:     Problems with dizziness:         Gastrointestinal    Blood in stool:     Vomited blood:         Genitourinary    Burning when urinating:     Blood in urine:        Psychiatric    Major depression:         Hematologic    Bleeding problems:    Problems with blood clotting too easily:        Skin    Rashes or ulcers:        Constitutional    Fever or chills:     PHYSICAL EXAM:   Vitals:   05/13/23 1532  BP: 120/76  Pulse: 63  Resp: 14  Temp: 97.7 F (36.5 C)  TempSrc: Temporal  SpO2: 99%    GENERAL: The patient is a well-nourished female, in no acute distress. The vital signs are documented above. CARDIAC: There is a regular rate and rhythm.  VASCULAR:  The patient has many varicose veins along the lateral aspect of the left leg as documented in the photographs below.       I did look at the left great saphenous vein myself with the SonoSite.  The anatomy is challenging.  The vein is dilated proximally up to 12 mm.  In the proximal thigh the vein splits and becomes bifid.  The larger of the 2 branches has reflux and is dilated down to the mid thigh.  Is here there is a segment that is quite tortuous and just below that the vein exits the fascia.  The more medial  saphenous vein is very small and does not have reflux.  Thus we would likely try to cannulate the vein in the mid thigh below the tortuosity.  If I were unable to get through that segment we would cannulate higher. PULMONARY: There is good air exchange bilaterally without wheezing or rales. ABDOMEN: Soft and non-tender with normal pitched bowel sounds.  MUSCULOSKELETAL: There are no major deformities or cyanosis. NEUROLOGIC: No focal weakness or paresthesias are detected. SKIN: There are no ulcers or rashes noted. PSYCHIATRIC: The patient has a normal affect.  DATA:    VENOUS DUPLEX: I have independently interpreted her venous duplex scan today.  This was of the left lower extremity only.  There was no evidence of DVT.  There was deep venous reflux in the common femoral vein.  There was superficial venous reflux in the left great saphenous vein  Diameters ranged from 4-12 mm.  The results of the study are summarized in the diagram below.    Waverly Ferrari [facility] [phone]

## 2023-05-21 ENCOUNTER — Other Ambulatory Visit: Payer: Self-pay | Admitting: *Deleted

## 2023-05-21 DIAGNOSIS — I83812 Varicose veins of left lower extremities with pain: Secondary | ICD-10-CM

## 2023-06-01 DIAGNOSIS — R040 Epistaxis: Secondary | ICD-10-CM | POA: Diagnosis not present

## 2023-06-02 ENCOUNTER — Other Ambulatory Visit: Payer: Self-pay | Admitting: *Deleted

## 2023-06-02 MED ORDER — LORAZEPAM 1 MG PO TABS: ORAL_TABLET | ORAL | 0 refills | Status: DC

## 2023-06-10 ENCOUNTER — Ambulatory Visit: Payer: Medicare Other | Admitting: Vascular Surgery

## 2023-06-10 ENCOUNTER — Encounter: Payer: Self-pay | Admitting: Vascular Surgery

## 2023-06-10 VITALS — BP 123/66 | HR 59 | Temp 97.6°F | Resp 15 | Ht 64.0 in | Wt 147.0 lb

## 2023-06-10 DIAGNOSIS — I872 Venous insufficiency (chronic) (peripheral): Secondary | ICD-10-CM | POA: Diagnosis not present

## 2023-06-10 DIAGNOSIS — I83812 Varicose veins of left lower extremities with pain: Secondary | ICD-10-CM | POA: Diagnosis not present

## 2023-06-10 HISTORY — PX: LASER ABLATION: SHX1947

## 2023-06-10 NOTE — Progress Notes (Signed)
     Laser Ablation Procedure    Date: 06/10/2023   Rebecca Camacho DOB:[birth year] Consent signed: Yes     Surgeon: Dr. Waverly Ferrari  Procedure: Laser Ablation: left Greater Saphenous Vein  BP 123/66 (BP Location: Left Arm, Patient Position: Sitting, Cuff Size: Normal)   Pulse (!) 59   Temp 97.6 F (36.4 C) (Temporal)   Resp 15   Ht 5\' 4"  (1.626 m)   Wt 147 lb (66.7 kg)   SpO2 99%   BMI 25.23 kg/m   Tumescent Anesthesia: 350  cc  0.9% NaCl with 50 cc Lidocaine HCL 1%  and 15 cc 8.4% NaHCO3  Local Anesthesia: 4cc Lidocaine HCL and NaHCO3 (ratio 2:1)  7 watts continuous mode     Total energy: 96 seconds     Total time: 672.2 joules Treatment Length 14 cm  Laser Fiber Ref. #  [id number]  Lot # [id number]   Stab Phlebectomy: >20 Sites: Thigh and Calf  Patient tolerated procedure well  Notes: All staff members wore facial masks. Pt had 1 tab (1 mg) Ativan @ 07:30 am and another 1 tab (1 mg) at 08:40 am making a total of (2 mg) prior to procedure   Description of Procedure:  After marking the course of the secondary varicosities, the patient was placed on the operating table in the supine position, and the left leg was prepped and draped in sterile fashion.   Local anesthetic was administered and under ultrasound guidance the saphenous vein was accessed with a micro needle and guide wire; then the mirco puncture sheath was placed.  A guide wire was inserted saphenofemoral junction , followed by a 5 french sheath.  The position of the sheath and then the laser fiber below the junction was confirmed using the ultrasound.  Tumescent anesthesia was administered along the course of the saphenous vein using ultrasound guidance. The patient was placed in Trendelenburg position and protective laser glasses were placed on patient and staff, and the laser was fired at 7 watts continuous mode for a total of 672.2  joules.   For stab phlebectomies, local anesthetic was administered  at the previously marked varicosities, and tumescent anesthesia was administered around the vessels.  Greater than 20 stab wounds were made using the tip of an 11 blade. And using the vein hook, the phlebectomies were performed using a hemostat to avulse the varicosities.  Adequate hemostasis was achieved.     Steri strips were applied to the stab wounds and ABD pads and thigh high compression stockings were applied.  Ace wrap bandages were applied over the phlebectomy sites and at the top of the saphenofemoral junction. Blood loss was less than 15 cc.  Discharge instructions reviewed with patient and hardcopy of discharge instructions given to patient to take home. The patient ambulated out of the operating room having tolerated the procedure well.

## 2023-06-10 NOTE — Progress Notes (Signed)
Patient name: Rebecca Camacho MRN: [id number] DOB: [birth year] Sex: female  REASON FOR VISIT: For laser ablation of the left great saphenous vein and greater than 20 stabs  HPI: Rebecca Camacho is a 67 y.o. female who presented with C4 venous disease.  She has symptoms from venous hypertension and is failed conservative treatment.  I felt that she would be a good candidate for laser ablation of the left great saphenous vein in the proximal thigh and greater than 20 stabs.  Current Outpatient Medications  Medication Sig Dispense Refill   albuterol (VENTOLIN HFA) 108 (90 Base) MCG/ACT inhaler Inhale 2 puffs into the lungs every 6 (six) hours as needed for wheezing or shortness of breath.      amLODipine (NORVASC) 10 MG tablet Take 1 tablet (10 mg total) by mouth daily. 90 tablet 2   Biotin 5000 MCG CAPS Take 5,000 mcg by mouth daily.     Calcium Carb-Cholecalciferol 500-400 MG-UNIT TABS Take 1 tablet by mouth at bedtime.     Cholecalciferol (VITAMIN D3) 5000 units CAPS Take 10,000 Units by mouth daily at 2 PM.     Cyanocobalamin (B-12) 2500 MCG TABS Take 2,500 mcg by mouth every Wednesday.     ferrous sulfate 325 (65 FE) MG EC tablet Take 325 mg by mouth daily at 2 PM.      furosemide (LASIX) 20 MG tablet Take 1 tablet (20 mg total) by mouth daily. 90 tablet 3   gabapentin (NEURONTIN) 300 MG capsule 300 mg as needed.     HYDROMET 5-1.5 MG/5ML syrup Take 5 mLs by mouth at bedtime as needed.     insulin lispro (HUMALOG) 100 UNIT/ML KwikPen Inject into the skin. Per sliding scale with meals     levothyroxine (SYNTHROID) 100 MCG tablet 100 mcg daily.     Multiple Vitamin (MULTIVITAMIN) tablet Take 4 tablets by mouth daily at 2 PM.      ONE TOUCH ULTRA TEST test strip      ONETOUCH DELICA LANCETS FINE MISC      potassium chloride SA (KLOR-CON M) 20 MEQ tablet Take 1 tablet (20 mEq total) by mouth daily. 90 tablet 3   REPATHA SURECLICK 140 MG/ML SOAJ Inject 140 mg into the skin every 14  (fourteen) days.     spironolactone (ALDACTONE) 25 MG tablet Take 1 tablet (25 mg total) by mouth daily. 90 tablet 2   TRESIBA FLEXTOUCH 100 UNIT/ML FlexTouch Pen Inject 15 Units into the skin daily.     No current facility-administered medications for this visit.    PHYSICAL EXAM: Vitals:   06/10/23 0833  BP: 123/66  Pulse: (!) 59  Temp: 97.6 F (36.4 C)  TempSrc: Temporal  SpO2: 99%  Weight: 147 lb (66.7 kg)    PROCEDURE: Laser ablation left great saphenous vein and greater than 20 stabs  TECHNIQUE: The patient was brought to the exam room and the dilated varicose veins in the left lower extremity were marked with the patient standing.  The patient was then placed supine.  I looked at the left great saphenous vein myself with the SonoSite and I felt that we could cannulate this in the mid thigh.  The left leg was prepped and draped in usual sterile fashion.  Under ultrasound guidance, after the skin was anesthetized, I cannulated the great saphenous vein in the mid thigh with a micropuncture needle and a micropuncture sheath was introduced over a wire.  I then advanced the J-wire to just below the  saphenofemoral junction.  A 45 cm sheath was advanced over the wire and the wire and dilator removed.  The laser fiber was positioned at the end of the sheath and the sheath retracted.  The laser fiber was positioned 2.5 cm distal to the saphenofemoral junction.  Next tumescent anesthesia was administered circumferentially around the vein.  The patient was placed in Trendelenburg.  We placed laser glasses.  Laser ablation was performed of the left great saphenous vein from 2-1/2 cm distal to the saphenofemoral junction to the mid thigh.   Next attention was turned to stab phlebectomies.  All the marked areas were anesthetized with tumescent anesthesia.  Making approximately 25-30 small stab incisions the veins were hooked brought above the skin and then bluntly excised.  Pressure was held for  hemostasis.  Steri-Strips were applied.  A pressure dressing was applied.  Patient tolerated the procedure well.  She will return in 1 week for a follow-up duplex.  Waverly Ferrari [facility] [place] [phone]

## 2023-06-12 NOTE — Telephone Encounter (Signed)
Opened in error

## 2023-06-17 ENCOUNTER — Encounter: Payer: Self-pay | Admitting: Vascular Surgery

## 2023-06-17 ENCOUNTER — Ambulatory Visit: Payer: Medicare Other | Admitting: Vascular Surgery

## 2023-06-17 ENCOUNTER — Ambulatory Visit (HOSPITAL_COMMUNITY)
Admission: RE | Admit: 2023-06-17 | Discharge: 2023-06-17 | Disposition: A | Discharge status: Home / Self Care | Payer: Medicare Other | Source: Ambulatory Visit | Attending: Vascular Surgery | Admitting: Vascular Surgery

## 2023-06-17 VITALS — BP 140/77 | HR 48 | Temp 97.9°F | Resp 18 | Ht 64.0 in | Wt 146.1 lb

## 2023-06-17 DIAGNOSIS — I83812 Varicose veins of left lower extremities with pain: Secondary | ICD-10-CM | POA: Diagnosis not present

## 2023-06-17 NOTE — Progress Notes (Signed)
She is right she does have 1 little  Patient name: Rebecca Camacho MRN: [id number] DOB: 1956-02-19 Sex: female  REASON FOR VISIT: Follow-up after laser ablation of the left great saphenous vein and greater than 20 stabs.  HPI: Rebecca Camacho is a 67 y.o. female who had previously undergone laser ablation of the right anterior accessory saphenous vein and on 06/10/2023 underwent laser ablation of the left great saphenous vein with greater than 20 stabs.  She had a bifid system up in the proximal thigh and the lateral branch was the issue and this was successfully ablated.  She has no specific complaints except for 1 small area on her shin which has a little bit of drainage.  She also has a varicosity distally in her leg which was causing some discomfort.    Current Outpatient Medications  Medication Sig Dispense Refill   albuterol (VENTOLIN HFA) 108 (90 Base) MCG/ACT inhaler Inhale 2 puffs into the lungs every 6 (six) hours as needed for wheezing or shortness of breath.      amLODipine (NORVASC) 10 MG tablet Take 1 tablet (10 mg total) by mouth daily. 90 tablet 2   Biotin 5000 MCG CAPS Take 5,000 mcg by mouth daily.     Calcium Carb-Cholecalciferol 500-400 MG-UNIT TABS Take 1 tablet by mouth at bedtime.     Cholecalciferol (VITAMIN D3) 5000 units CAPS Take 10,000 Units by mouth daily at 2 PM.     Cyanocobalamin (B-12) 2500 MCG TABS Take 2,500 mcg by mouth every Wednesday.     ferrous sulfate 325 (65 FE) MG EC tablet Take 325 mg by mouth daily at 2 PM.      furosemide (LASIX) 20 MG tablet Take 1 tablet (20 mg total) by mouth daily. 90 tablet 3   gabapentin (NEURONTIN) 300 MG capsule 300 mg as needed.     HYDROMET 5-1.5 MG/5ML syrup Take 5 mLs by mouth at bedtime as needed.     insulin lispro (HUMALOG) 100 UNIT/ML KwikPen Inject into the skin. Per sliding scale with meals     levothyroxine (SYNTHROID) 100 MCG tablet 100 mcg daily.     Multiple Vitamin (MULTIVITAMIN) tablet Take 4 tablets by  mouth daily at 2 PM.      ONE TOUCH ULTRA TEST test strip      ONETOUCH DELICA LANCETS FINE MISC      potassium chloride SA (KLOR-CON M) 20 MEQ tablet Take 1 tablet (20 mEq total) by mouth daily. 90 tablet 3   REPATHA SURECLICK 140 MG/ML SOAJ Inject 140 mg into the skin every 14 (fourteen) days.     spironolactone (ALDACTONE) 25 MG tablet Take 1 tablet (25 mg total) by mouth daily. 90 tablet 2   TRESIBA FLEXTOUCH 100 UNIT/ML FlexTouch Pen Inject 15 Units into the skin daily.     No current facility-administered medications for this visit.   REVIEW OF SYSTEMS: Arly.Keller ] denotes positive finding; [  ] denotes negative finding  CARDIOVASCULAR:  [ ]  chest pain   [ ]  dyspnea on exertion  [ ]  leg swelling  CONSTITUTIONAL:  [ ]  fever   [ ]  chills  PHYSICAL EXAM: Vitals:   06/17/23 1047  BP: (!) 140/77  Pulse: (!) 48  Resp: 18  Temp: 97.9 F (36.6 C)  TempSrc: Temporal  SpO2: 99%  Weight: 146 lb 1.6 oz (66.3 kg)  Height: 5\' 4"  (1.626 m)   GENERAL: The patient is a well-nourished female, in no acute distress. The vital signs are  documented above. CARDIOVASCULAR: There is a regular rate and rhythm. PULMONARY: There is good air exchange bilaterally without wheezing or rales. VASCULAR: She has minimal bruising.  Incisions are healing nicely.  The area over the shin currently does not have any significant drainage.  The varicosity where she was complaining of some pain appears to be a perforator as I can feel the fascial defect below this.  I do not feel any evidence of thrombus or superficial thrombophlebitis.  DATA:  VENOUS DUPLEX: I have independently interpreted her venous duplex scan today.  There was no evidence of DVT in the left lower extremity.  The great saphenous vein (lateral branch) was successfully closed from the mid thigh to within 9 mm of the saphenofemoral junction.  MEDICAL ISSUES:  S/P LASER ABLATION LEFT GREAT SAPHENOUS VEIN WITH GREATER THAN 20 STABS: Patient is doing well  status post laser ablation and stab phlebectomies.  She has 1 more week with her thigh-high compression stocking.  I encouraged her to continue to elevate her legs.  She can resume her normal activities.  We will see her back as needed.  Waverly Ferrari [facility] [place] [phone]  I

## 2023-06-23 DIAGNOSIS — K080 Exfoliation of teeth due to systemic causes: Secondary | ICD-10-CM | POA: Diagnosis not present

## 2023-06-24 DIAGNOSIS — R040 Epistaxis: Secondary | ICD-10-CM | POA: Diagnosis not present

## 2023-06-25 DIAGNOSIS — K080 Exfoliation of teeth due to systemic causes: Secondary | ICD-10-CM | POA: Diagnosis not present

## 2023-07-14 DIAGNOSIS — E039 Hypothyroidism, unspecified: Secondary | ICD-10-CM | POA: Diagnosis not present

## 2023-07-14 DIAGNOSIS — E785 Hyperlipidemia, unspecified: Secondary | ICD-10-CM | POA: Diagnosis not present

## 2023-07-14 DIAGNOSIS — Z1212 Encounter for screening for malignant neoplasm of rectum: Secondary | ICD-10-CM | POA: Diagnosis not present

## 2023-07-14 DIAGNOSIS — E782 Mixed hyperlipidemia: Secondary | ICD-10-CM | POA: Diagnosis not present

## 2023-07-14 DIAGNOSIS — E1165 Type 2 diabetes mellitus with hyperglycemia: Secondary | ICD-10-CM | POA: Diagnosis not present

## 2023-07-19 DIAGNOSIS — Z1212 Encounter for screening for malignant neoplasm of rectum: Secondary | ICD-10-CM | POA: Diagnosis not present

## 2023-07-21 DIAGNOSIS — E1165 Type 2 diabetes mellitus with hyperglycemia: Secondary | ICD-10-CM | POA: Diagnosis not present

## 2023-07-21 DIAGNOSIS — E113299 Type 2 diabetes mellitus with mild nonproliferative diabetic retinopathy without macular edema, unspecified eye: Secondary | ICD-10-CM | POA: Diagnosis not present

## 2023-07-21 DIAGNOSIS — I10 Essential (primary) hypertension: Secondary | ICD-10-CM | POA: Diagnosis not present

## 2023-07-21 DIAGNOSIS — R82998 Other abnormal findings in urine: Secondary | ICD-10-CM | POA: Diagnosis not present

## 2023-07-21 DIAGNOSIS — Z23 Encounter for immunization: Secondary | ICD-10-CM | POA: Diagnosis not present

## 2023-07-21 DIAGNOSIS — Z0000 Encounter for general adult medical examination without abnormal findings: Secondary | ICD-10-CM | POA: Diagnosis not present

## 2023-07-21 DIAGNOSIS — Z1339 Encounter for screening examination for other mental health and behavioral disorders: Secondary | ICD-10-CM | POA: Diagnosis not present

## 2023-07-21 DIAGNOSIS — Z1331 Encounter for screening for depression: Secondary | ICD-10-CM | POA: Diagnosis not present

## 2023-07-24 ENCOUNTER — Other Ambulatory Visit: Payer: Self-pay

## 2023-07-24 DIAGNOSIS — R600 Localized edema: Secondary | ICD-10-CM

## 2023-07-24 DIAGNOSIS — Z79899 Other long term (current) drug therapy: Secondary | ICD-10-CM

## 2023-07-24 DIAGNOSIS — R011 Cardiac murmur, unspecified: Secondary | ICD-10-CM

## 2023-07-24 MED ORDER — AMLODIPINE BESYLATE 10 MG PO TABS: 10.0000 mg | ORAL_TABLET | Freq: Every day | ORAL | 0 refills | Status: DC

## 2023-07-24 MED ORDER — SPIRONOLACTONE 25 MG PO TABS: 25.0000 mg | ORAL_TABLET | Freq: Every day | ORAL | 0 refills | Status: AC

## 2023-07-30 DIAGNOSIS — L814 Other melanin hyperpigmentation: Secondary | ICD-10-CM | POA: Diagnosis not present

## 2023-07-30 DIAGNOSIS — D2261 Melanocytic nevi of right upper limb, including shoulder: Secondary | ICD-10-CM | POA: Diagnosis not present

## 2023-07-30 DIAGNOSIS — L570 Actinic keratosis: Secondary | ICD-10-CM | POA: Diagnosis not present

## 2023-07-30 DIAGNOSIS — E119 Type 2 diabetes mellitus without complications: Secondary | ICD-10-CM | POA: Diagnosis not present

## 2023-07-30 DIAGNOSIS — L821 Other seborrheic keratosis: Secondary | ICD-10-CM | POA: Diagnosis not present

## 2023-07-30 DIAGNOSIS — Z85828 Personal history of other malignant neoplasm of skin: Secondary | ICD-10-CM | POA: Diagnosis not present

## 2023-08-20 DIAGNOSIS — K080 Exfoliation of teeth due to systemic causes: Secondary | ICD-10-CM | POA: Diagnosis not present

## 2023-08-30 ENCOUNTER — Encounter (HOSPITAL_BASED_OUTPATIENT_CLINIC_OR_DEPARTMENT_OTHER): Payer: Self-pay | Admitting: Cardiology

## 2023-08-31 NOTE — Progress Notes (Signed)
Cardiology Office Note:  .   Date:  09/01/2023  ID:  Rebecca Camacho, Rebecca Camacho 1956/02/15, MRN [id number] PCP: Garlan Fillers, MD  [facility] Providers Cardiologist:  Jodelle Red, MD {  History of Present Illness: .   Rebecca Camacho is a 67 y.o. female with PMH chronic diastolic heart failure, type II diabetes, hypertension, hyperlipidemia, obesity s/p gastric bypass surgery 2013, chronic venous insufficiency. She was previously followed by Dr. Shari Prows, and I met her on 09/01/23.  Pertinent CV history: Echo 2023 with EF 73%, normal GLS, indeterminate DD, elevated LVEDP. Follows with Dr. Edilia Bo for varicose veins s/p laser ablation and stab phlebectomies.  Today; For the last two weeks, has had "shaking" vision, some nausea. Doesn't seem to be associated with blood pressure or blood sugar. Reviewed home BP numbers. Vision symptoms have been frequent, can be several times a day, lasts about an hour. If she can close her eyes and lay down it goes away, but then she has a headache for the rest of the day. Had an eye exam about a month ago which was normal, but she was not having symptoms at the time. No complete loss of vision.   She swims regularly, usually about twice a week, swims about a mile in an hour. Also walks daily, ~10k steps/day.  Had SIBO, treated with xifaxin, felt much better with treatment.  FH: father, paternal gpa has heart disease. Brother has afib.  ROS: Denies chest pain, shortness of breath at rest or with normal exertion. No PND, orthopnea, LE edema or unexpected weight gain. No syncope or palpitations. ROS otherwise negative except as noted.   Studies Reviewed: Marland Kitchen    EKG:  EKG Interpretation Date/Time:  Tuesday September 01 2023 08:37:34 EST Ventricular Rate:  62 PR Interval:  212 QRS Duration:  78 QT Interval:  424 QTC Calculation: 430 R Axis:   12  Text Interpretation: Sinus rhythm with 1st degree A-V block Septal infarct (cited on  or before 30-Mar-2018) Confirmed by Jodelle Red [phone]) on 09/01/2023 8:40:43 AM    Physical Exam:   VS:  BP 126/60 (BP Location: Left Arm, Patient Position: Sitting, Cuff Size: Normal)   Pulse 62   Ht 5\' 4"  (1.626 m)   Wt 147 lb (66.7 kg)   SpO2 96%   BMI 25.23 kg/m    Wt Readings from Last 3 Encounters:  09/01/23 147 lb (66.7 kg)  06/17/23 146 lb 1.6 oz (66.3 kg)  06/10/23 147 lb (66.7 kg)    GEN: Well nourished, well developed in no acute distress HEENT: Normal, moist mucous membranes NECK: No JVD CARDIAC: regular rhythm, normal S1 and S2, no rubs or gallops. 1/6 systolic murmur. VASCULAR: Radial and DP pulses 2+ bilaterally. No carotid bruits RESPIRATORY:  Clear to auscultation without rales, wheezing or rhonchi  ABDOMEN: Soft, non-tender, non-distended MUSCULOSKELETAL:  Ambulates independently SKIN: Warm and dry, no edema NEUROLOGIC:  Alert and oriented x 3. No focal neuro deficits noted. PSYCHIATRIC:  Normal affect    ASSESSMENT AND PLAN: .    Chronic diastolic heart failure Type II diabetes, on insulin -continue furosemide, spironolactone -would benefit from SGLT2i. However, she was previously on farxiga, had terrible yeast infections. -no complications from diabetes. Had gestational diabetes with two pregnancies, never went away after delivery, >35 years. Started on pills, started insulin a long time ago. 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 TG 78, HDL 67  Mild MR -asymptomatic  Varicose veins -follows with Dr. Edilia Bo -no LE edema today  CV risk counseling and prevention -recommend heart healthy/Mediterranean diet, with whole grains, fruits, vegetable, fish, lean meats, nuts, and olive oil. Limit salt. -recommend moderate walking, 3-5 times/week for  30-50 minutes each session. Aim for at least 150 minutes.week. Goal should be pace of 3 miles/hours, or walking 1.5 miles in 30 minutes -recommend avoidance of tobacco products. Avoid excess alcohol.  Dispo: 1 year or sooner as needed  Signed, Jodelle Red, MD   Jodelle Red, MD, PhD, [facility]  [facility]  [place]  [facility] & Vascular at [facility] at [facility] [address], [address] [place], [place] [address] [phone]

## 2023-09-01 ENCOUNTER — Encounter (HOSPITAL_BASED_OUTPATIENT_CLINIC_OR_DEPARTMENT_OTHER): Payer: Self-pay | Admitting: Cardiology

## 2023-09-01 ENCOUNTER — Ambulatory Visit (HOSPITAL_BASED_OUTPATIENT_CLINIC_OR_DEPARTMENT_OTHER): Payer: Medicare Other | Admitting: Cardiology

## 2023-09-01 VITALS — BP 126/60 | HR 62 | Ht 64.0 in | Wt 147.0 lb

## 2023-09-01 DIAGNOSIS — I5032 Chronic diastolic (congestive) heart failure: Secondary | ICD-10-CM

## 2023-09-01 DIAGNOSIS — T466X5D Adverse effect of antihyperlipidemic and antiarteriosclerotic drugs, subsequent encounter: Secondary | ICD-10-CM

## 2023-09-01 DIAGNOSIS — E119 Type 2 diabetes mellitus without complications: Secondary | ICD-10-CM

## 2023-09-01 DIAGNOSIS — G720 Drug-induced myopathy: Secondary | ICD-10-CM | POA: Insufficient documentation

## 2023-09-01 DIAGNOSIS — I10 Essential (primary) hypertension: Secondary | ICD-10-CM | POA: Diagnosis not present

## 2023-09-01 DIAGNOSIS — Z794 Long term (current) use of insulin: Secondary | ICD-10-CM

## 2023-09-01 DIAGNOSIS — E7800 Pure hypercholesterolemia, unspecified: Secondary | ICD-10-CM

## 2023-09-01 DIAGNOSIS — I83893 Varicose veins of bilateral lower extremities with other complications: Secondary | ICD-10-CM

## 2023-09-01 DIAGNOSIS — I872 Venous insufficiency (chronic) (peripheral): Secondary | ICD-10-CM

## 2023-09-01 NOTE — Patient Instructions (Signed)

## 2023-09-03 DIAGNOSIS — E119 Type 2 diabetes mellitus without complications: Secondary | ICD-10-CM | POA: Diagnosis not present

## 2023-09-03 DIAGNOSIS — G43109 Migraine with aura, not intractable, without status migrainosus: Secondary | ICD-10-CM | POA: Diagnosis not present

## 2023-09-08 ENCOUNTER — Encounter (HOSPITAL_BASED_OUTPATIENT_CLINIC_OR_DEPARTMENT_OTHER): Payer: Self-pay | Admitting: Family Medicine

## 2023-09-08 ENCOUNTER — Ambulatory Visit (HOSPITAL_BASED_OUTPATIENT_CLINIC_OR_DEPARTMENT_OTHER)
Admission: RE | Admit: 2023-09-08 | Discharge: 2023-09-08 | Disposition: A | Discharge status: Home / Self Care | Payer: Medicare Other | Source: Ambulatory Visit | Attending: Family Medicine | Admitting: Family Medicine

## 2023-09-08 ENCOUNTER — Other Ambulatory Visit (HOSPITAL_BASED_OUTPATIENT_CLINIC_OR_DEPARTMENT_OTHER): Payer: Self-pay | Admitting: Family Medicine

## 2023-09-08 DIAGNOSIS — Z471 Aftercare following joint replacement surgery: Secondary | ICD-10-CM | POA: Diagnosis not present

## 2023-09-08 DIAGNOSIS — G454 Transient global amnesia: Secondary | ICD-10-CM | POA: Insufficient documentation

## 2023-09-08 DIAGNOSIS — Z0000 Encounter for general adult medical examination without abnormal findings: Secondary | ICD-10-CM | POA: Diagnosis not present

## 2023-09-08 DIAGNOSIS — Z1389 Encounter for screening for other disorder: Secondary | ICD-10-CM | POA: Diagnosis not present

## 2023-09-08 MED ORDER — IOHEXOL 300 MG/ML  SOLN
75.0000 mL | Freq: Once | INTRAMUSCULAR | Status: AC | PRN
  Administered 2023-09-08: 75 mL via INTRAVENOUS

## 2023-09-21 DIAGNOSIS — R051 Acute cough: Secondary | ICD-10-CM | POA: Diagnosis not present

## 2023-09-21 DIAGNOSIS — J189 Pneumonia, unspecified organism: Secondary | ICD-10-CM | POA: Diagnosis not present

## 2023-10-08 DIAGNOSIS — Z8673 Personal history of transient ischemic attack (TIA), and cerebral infarction without residual deficits: Secondary | ICD-10-CM | POA: Diagnosis not present

## 2023-10-08 DIAGNOSIS — G43109 Migraine with aura, not intractable, without status migrainosus: Secondary | ICD-10-CM | POA: Diagnosis not present

## 2023-10-08 DIAGNOSIS — R4701 Aphasia: Secondary | ICD-10-CM | POA: Diagnosis not present

## 2023-10-08 DIAGNOSIS — R202 Paresthesia of skin: Secondary | ICD-10-CM | POA: Diagnosis not present

## 2023-10-26 DIAGNOSIS — R4701 Aphasia: Secondary | ICD-10-CM | POA: Diagnosis not present

## 2023-10-26 DIAGNOSIS — R519 Headache, unspecified: Secondary | ICD-10-CM | POA: Diagnosis not present

## 2023-10-27 DIAGNOSIS — Z1231 Encounter for screening mammogram for malignant neoplasm of breast: Secondary | ICD-10-CM | POA: Diagnosis not present

## 2023-10-27 DIAGNOSIS — Z01419 Encounter for gynecological examination (general) (routine) without abnormal findings: Secondary | ICD-10-CM | POA: Diagnosis not present

## 2023-10-27 DIAGNOSIS — Z124 Encounter for screening for malignant neoplasm of cervix: Secondary | ICD-10-CM | POA: Diagnosis not present

## 2023-10-27 DIAGNOSIS — Z6827 Body mass index (BMI) 27.0-27.9, adult: Secondary | ICD-10-CM | POA: Diagnosis not present

## 2023-11-04 DIAGNOSIS — K080 Exfoliation of teeth due to systemic causes: Secondary | ICD-10-CM | POA: Diagnosis not present

## 2023-11-19 DIAGNOSIS — Z79899 Other long term (current) drug therapy: Secondary | ICD-10-CM | POA: Diagnosis not present

## 2023-11-19 DIAGNOSIS — G43109 Migraine with aura, not intractable, without status migrainosus: Secondary | ICD-10-CM | POA: Diagnosis not present

## 2023-11-19 DIAGNOSIS — Z8673 Personal history of transient ischemic attack (TIA), and cerebral infarction without residual deficits: Secondary | ICD-10-CM | POA: Diagnosis not present

## 2023-12-16 DIAGNOSIS — I10 Essential (primary) hypertension: Secondary | ICD-10-CM | POA: Diagnosis not present

## 2023-12-16 DIAGNOSIS — E1165 Type 2 diabetes mellitus with hyperglycemia: Secondary | ICD-10-CM | POA: Diagnosis not present

## 2024-01-27 DIAGNOSIS — E11319 Type 2 diabetes mellitus with unspecified diabetic retinopathy without macular edema: Secondary | ICD-10-CM | POA: Diagnosis not present

## 2024-01-27 DIAGNOSIS — R058 Other specified cough: Secondary | ICD-10-CM | POA: Diagnosis not present

## 2024-01-28 DIAGNOSIS — M8588 Other specified disorders of bone density and structure, other site: Secondary | ICD-10-CM | POA: Diagnosis not present

## 2024-03-15 DIAGNOSIS — G43109 Migraine with aura, not intractable, without status migrainosus: Secondary | ICD-10-CM | POA: Diagnosis not present

## 2024-03-15 DIAGNOSIS — Z8673 Personal history of transient ischemic attack (TIA), and cerebral infarction without residual deficits: Secondary | ICD-10-CM | POA: Diagnosis not present

## 2024-04-21 DIAGNOSIS — E1165 Type 2 diabetes mellitus with hyperglycemia: Secondary | ICD-10-CM | POA: Diagnosis not present

## 2024-05-05 ENCOUNTER — Other Ambulatory Visit: Payer: Self-pay

## 2024-05-05 DIAGNOSIS — R600 Localized edema: Secondary | ICD-10-CM

## 2024-05-05 DIAGNOSIS — Z79899 Other long term (current) drug therapy: Secondary | ICD-10-CM

## 2024-05-05 DIAGNOSIS — R011 Cardiac murmur, unspecified: Secondary | ICD-10-CM

## 2024-05-05 MED ORDER — FUROSEMIDE 20 MG PO TABS: 20.0000 mg | ORAL_TABLET | Freq: Every day | ORAL | 1 refills | Status: DC

## 2024-05-19 DIAGNOSIS — K080 Exfoliation of teeth due to systemic causes: Secondary | ICD-10-CM | POA: Diagnosis not present

## 2024-06-07 DIAGNOSIS — I10 Essential (primary) hypertension: Secondary | ICD-10-CM | POA: Diagnosis not present

## 2024-06-07 DIAGNOSIS — E1165 Type 2 diabetes mellitus with hyperglycemia: Secondary | ICD-10-CM | POA: Diagnosis not present

## 2024-06-21 DIAGNOSIS — Z96651 Presence of right artificial knee joint: Secondary | ICD-10-CM | POA: Diagnosis not present

## 2024-06-21 DIAGNOSIS — T8484XA Pain due to internal orthopedic prosthetic devices, implants and grafts, initial encounter: Secondary | ICD-10-CM | POA: Diagnosis not present

## 2024-06-21 DIAGNOSIS — Z96659 Presence of unspecified artificial knee joint: Secondary | ICD-10-CM | POA: Diagnosis not present

## 2024-06-23 DIAGNOSIS — R058 Other specified cough: Secondary | ICD-10-CM | POA: Diagnosis not present

## 2024-06-23 DIAGNOSIS — U071 COVID-19: Secondary | ICD-10-CM | POA: Diagnosis not present

## 2024-06-23 DIAGNOSIS — J029 Acute pharyngitis, unspecified: Secondary | ICD-10-CM | POA: Diagnosis not present

## 2024-06-27 ENCOUNTER — Ambulatory Visit: Attending: Family

## 2024-06-27 ENCOUNTER — Telehealth: Payer: Self-pay | Admitting: Cardiology

## 2024-06-27 DIAGNOSIS — R001 Bradycardia, unspecified: Secondary | ICD-10-CM

## 2024-06-27 NOTE — Telephone Encounter (Signed)
 History of bradycardia but not previously symptomatic. She is not on AV nodal blocking agent.   She could continue to monitor HR at home until Friday and report back update or we can mail 7 day ZIO monitor - whichever is her preference.   Recommend staying well hydrated, making position changes slowly, ensuring she is eating regular meals to prevent dizziness.   Ahnna Dungan S Natascha Edmonds, NP

## 2024-06-27 NOTE — Telephone Encounter (Signed)
 STAT if HR is under 50 or over 120 (normal HR is 60-100 beats per minute)  What is your heart rate? 35   Do you have a log of your heart rate readings (document readings)? 64 up, 35 resting  Do you have any other symptoms? Clammy, goose bumps, anxiety, lightheaded. Pt says she felt a zap in her heart.   Pt's heart rate dropping low when she lays down.

## 2024-06-27 NOTE — Telephone Encounter (Signed)
 Spoke to patient Rebecca Camacho's advice given.Stated her slow heart beat is at night.Stated her apple watch wakes her up.She will wear 7 day zio.Advised Zio will be mailed to her home with instructions.Advised to call # on box if she has any questions.Advised of staying well hydrated,change positions slowly.Stated she is eating regular meals.

## 2024-06-27 NOTE — Telephone Encounter (Signed)
  Patient is calling to follow up. She is hoping to get a callback today

## 2024-06-27 NOTE — Progress Notes (Unsigned)
 Enrolled for [employer] to mail a ZIO XT long term holter monitor to the patients address on file.   Requested monitor to be mailed 07/04/24.  Dr. Shelda Bruckner to read.

## 2024-06-27 NOTE — Telephone Encounter (Signed)
 Spoke with patient regarding low heart rate.  Was diagnosed with COVID on Wednesday, feeling better and no fever for 2 days She didn't take anything for COVID but did use Promethazine for cough.  Took Promethazine last night around 10 pm, woke up around 12ish with a feeling like she had a bad dream. Felt like her heart was racing, clammy, anxious but didn't last long When she was lying in bed trying to go back to sleep she kept getting notifications from her phone that her HR was low ranging from 35-40, normally runs in the upper 50's  This morning she did lay in bed for a while when she first woke up  and heart rate seemed to be doing same registering low  Today HR seems to be better and last check 62 after taking a little nap  Did get notifications a couple of nights ago from her phone but didn't know what it was from so she ignored them.  New phone, only has had for about 1 month  Has noticed at times she will get a dizzy/strange feeling in her head that lasts only about a second or two.   Will forward to Reche ORN NP and Dr Lonni for review

## 2024-06-28 ENCOUNTER — Encounter (HOSPITAL_BASED_OUTPATIENT_CLINIC_OR_DEPARTMENT_OTHER): Payer: Self-pay

## 2024-06-28 NOTE — Telephone Encounter (Signed)
Addressed via separate encounter.   Alver Sorrow, NP

## 2024-07-14 DIAGNOSIS — R001 Bradycardia, unspecified: Secondary | ICD-10-CM | POA: Diagnosis not present

## 2024-07-15 ENCOUNTER — Encounter (HOSPITAL_BASED_OUTPATIENT_CLINIC_OR_DEPARTMENT_OTHER): Payer: Self-pay

## 2024-07-25 DIAGNOSIS — R058 Other specified cough: Secondary | ICD-10-CM | POA: Diagnosis not present

## 2024-07-25 DIAGNOSIS — E782 Mixed hyperlipidemia: Secondary | ICD-10-CM | POA: Diagnosis not present

## 2024-07-25 DIAGNOSIS — Z1212 Encounter for screening for malignant neoplasm of rectum: Secondary | ICD-10-CM | POA: Diagnosis not present

## 2024-07-25 DIAGNOSIS — E034 Atrophy of thyroid (acquired): Secondary | ICD-10-CM | POA: Diagnosis not present

## 2024-07-25 DIAGNOSIS — E1165 Type 2 diabetes mellitus with hyperglycemia: Secondary | ICD-10-CM | POA: Diagnosis not present

## 2024-07-25 LAB — LAB REPORT - SCANNED
A1c: 7.8
EGFR: 61.9

## 2024-08-01 DIAGNOSIS — Z0000 Encounter for general adult medical examination without abnormal findings: Secondary | ICD-10-CM | POA: Diagnosis not present

## 2024-08-01 DIAGNOSIS — R82998 Other abnormal findings in urine: Secondary | ICD-10-CM | POA: Diagnosis not present

## 2024-08-01 DIAGNOSIS — Z23 Encounter for immunization: Secondary | ICD-10-CM | POA: Diagnosis not present

## 2024-08-01 DIAGNOSIS — E11319 Type 2 diabetes mellitus with unspecified diabetic retinopathy without macular edema: Secondary | ICD-10-CM | POA: Diagnosis not present

## 2024-08-01 DIAGNOSIS — I10 Essential (primary) hypertension: Secondary | ICD-10-CM | POA: Diagnosis not present

## 2024-08-01 DIAGNOSIS — E1165 Type 2 diabetes mellitus with hyperglycemia: Secondary | ICD-10-CM | POA: Diagnosis not present

## 2024-08-02 LAB — LAB REPORT - SCANNED
Albumin, Urine POC: <3
Creatinine, POC: 62.4 mg/dL
Microalb Creat Ratio: <5

## 2024-08-11 DIAGNOSIS — Z85828 Personal history of other malignant neoplasm of skin: Secondary | ICD-10-CM | POA: Diagnosis not present

## 2024-08-11 DIAGNOSIS — L814 Other melanin hyperpigmentation: Secondary | ICD-10-CM | POA: Diagnosis not present

## 2024-08-11 DIAGNOSIS — L821 Other seborrheic keratosis: Secondary | ICD-10-CM | POA: Diagnosis not present

## 2024-08-11 DIAGNOSIS — I788 Other diseases of capillaries: Secondary | ICD-10-CM | POA: Diagnosis not present

## 2024-08-11 DIAGNOSIS — L570 Actinic keratosis: Secondary | ICD-10-CM | POA: Diagnosis not present

## 2024-09-05 DIAGNOSIS — E119 Type 2 diabetes mellitus without complications: Secondary | ICD-10-CM | POA: Diagnosis not present

## 2024-09-08 DIAGNOSIS — R001 Bradycardia, unspecified: Secondary | ICD-10-CM | POA: Diagnosis not present

## 2024-09-09 ENCOUNTER — Encounter (HOSPITAL_BASED_OUTPATIENT_CLINIC_OR_DEPARTMENT_OTHER): Payer: Self-pay | Admitting: Family

## 2024-09-09 ENCOUNTER — Ambulatory Visit (HOSPITAL_BASED_OUTPATIENT_CLINIC_OR_DEPARTMENT_OTHER): Admitting: Family

## 2024-09-09 ENCOUNTER — Ambulatory Visit (HOSPITAL_BASED_OUTPATIENT_CLINIC_OR_DEPARTMENT_OTHER): Payer: Self-pay | Admitting: Family

## 2024-09-09 VITALS — BP 118/58 | HR 64 | Ht 64.0 in | Wt 151.4 lb

## 2024-09-09 DIAGNOSIS — Z8249 Family history of ischemic heart disease and other diseases of the circulatory system: Secondary | ICD-10-CM | POA: Diagnosis not present

## 2024-09-09 DIAGNOSIS — I10 Essential (primary) hypertension: Secondary | ICD-10-CM | POA: Diagnosis not present

## 2024-09-09 DIAGNOSIS — E785 Hyperlipidemia, unspecified: Secondary | ICD-10-CM | POA: Diagnosis not present

## 2024-09-09 DIAGNOSIS — I5032 Chronic diastolic (congestive) heart failure: Secondary | ICD-10-CM | POA: Diagnosis not present

## 2024-09-09 NOTE — Progress Notes (Unsigned)
  Cardiology Office Note   Date:  09/09/2024  ID:  Geniece  P Vista, Sawatzky [birth year], MRN [id number] PCP: Yolande Toribio MATSU, MD  [facility] Providers Cardiologist:  Shelda Bruckner, MD { Click to update primary MD,subspecialty MD or APP then REFRESH:1}    History of Present Illness Rebecca  P Camacho is a 68 y.o. female ***  She contacted the office noting a low heart rate in the 40s at sleep noted by her Ssmart watch and soe dizziness with walking. She attributes prior symptoms to COVID. Took her 6 weeks to feel back to herself.   Today she reports feeling overall well.   ?CCTA due to family hx  Worked at [employer] part time  ROS: Please see the history of present illness.    All other systems reviewed and are negative.   Studies Reviewed EKG Interpretation Date/Time:  Friday September 09 2024 13:47:20 EST Ventricular Rate:  58 PR Interval:  192 QRS Duration:  76 QT Interval:  420 QTC Calculation: 412 R Axis:   6  Text Interpretation: Sinus bradycardia  No acute ST/T wave changes. Confirmed by Vannie Mora ([id number]) on 09/09/2024 1:51:17 PM    *** Risk Assessment/Calculations           Physical Exam VS:  BP (!) 118/58   Pulse 64   Ht 5' 4 (1.626 m)   Wt 151 lb 6.4 oz (68.7 kg)   SpO2 98%   BMI 25.99 kg/m        Wt Readings from Last 3 Encounters:  09/09/24 151 lb 6.4 oz (68.7 kg)  09/01/23 147 lb (66.7 kg)  06/17/23 146 lb 1.6 oz (66.3 kg)    GEN: Well nourished, well developed in no acute distress NECK: No JVD; No carotid bruits CARDIAC: RRR, gr1/6 systolic murmurs, no rubs, gallops RESPIRATORY:  Clear to auscultation without rales, wheezing or rhonchi  ABDOMEN: Soft, non-tender, non-distended EXTREMITIES:  No edema; No deformity   ASSESSMENT AND PLAN HFpEF -   DM2 -  Prior yeast infection on Farxiga  HTN - BP well controlled. Continue current antihypertensive regimen.    HLD  / Statin myopathy - now on Repatha. Will request  most recent labs from primary care provider.  Mild MR - stable 1/6 systolic murmur. No dyspnea, near syncope ***.        Dispo: follow up in 1 year  Signed, Mora GORMAN Vannie, NP

## 2024-09-09 NOTE — Patient Instructions (Addendum)
 Medication Instructions:  Continue your current medications  *If you need a refill on your cardiac medications before your next appointment, please call your pharmacy*  Lab Work: Your physician recommends that you return for lab work today: BMET  If you have labs (blood work) drawn today and your tests are completely normal, you will receive your results only by: MyChart Message (if you have MyChart) OR A paper copy in the mail If you have any lab test that is abnormal or we need to change your treatment, we will call you to review the results.  Testing/Procedures: Your provider has recommended cardiac CTA.   Follow-Up: At [facility], you and your health needs are our priority.  As part of our continuing mission to provide you with exceptional heart care, our providers are all part of one team.  This team includes your primary Cardiologist (physician) and Advanced Practice Providers or APPs (Physician Assistants and Nurse Practitioners) who all work together to provide you with the care you need, when you need it.  Your next appointment:   1 year(s)  Provider:   Shelda Bruckner, MD, Rosaline Bane, NP, or Reche Finder, NP    We recommend signing up for the patient portal called MyChart.  Sign up information is provided on this After Visit Summary.  MyChart is used to connect with patients for Virtual Visits (Telemedicine).  Patients are able to view lab/test results, encounter notes, upcoming appointments, etc.  Non-urgent messages can be sent to your provider as well.   To learn more about what you can do with MyChart, go to [email or web address].   Other Instructions  Your cardiac CT will be scheduled at one of the below locations:   [facility] [address] [place], [place] [address] [phone] (Severe contrast allergies only)  OR   [facility] [address] [place], [place] [address] [phone]  OR    [facility] [address] [place], [place] [address] [phone]  OR   [facility]IR[facility]. [facility] and Vascular Tower [address]  [place], [place] [address]  OR   [facility] [address] [place], [place] [phone]  If scheduled at [facility], please arrive at the [facility] and Children's Entrance (Entrance C2) of [facility] 30 minutes prior to test start time. You can use the FREE valet parking offered at entrance C (encouraged to control the heart rate for the test)  Proceed to the [facility] Radiology Department (first floor) to check-in and test prep.  All radiology patients and guests should use entrance C2 at [facility], accessed from [facility], even though the hospital's physical address listed is [address].  If scheduled at the Heart and Vascular Tower at [address], please enter the parking lot using the [address] entrance and use the FREE valet service at the patient drop-off area. Enter the building and check-in with registration on the main floor.  If scheduled at [facility], please arrive to the Heart and Vascular Center 15 mins early for check-in and test prep.  There is spacious parking and easy access to the radiology department from the [facility] Heart and Vascular entrance. Please enter here and check-in with the desk attendant.   If scheduled at [facility], please arrive 30 minutes early for check-in and test prep.  Please follow these instructions carefully (unless otherwise directed):  An IV will be required for  this test and Nitroglycerin  will be given.  Hold all erectile dysfunction medications at least 3 days (72 hrs) prior to test. (Ie viagra, cialis, sildenafil, tadalafil, etc)   On the Night Before the Test: Be sure to Drink plenty of water. Do not consume any caffeinated/decaffeinated beverages or chocolate 12 hours prior to your  test. Do not take any antihistamines 12 hours prior to your test.  If the patient has contrast allergy: Patient will need a prescription for Prednisone and very clear instructions (as follows): Prednisone 50 mg - take 13 hours prior to test Take another Prednisone 50 mg 7 hours prior to test Take another Prednisone 50 mg 1 hour prior to test Take Benadryl  50 mg 1 hour prior to test Patient must complete all four doses of above prophylactic medications. Patient will need a ride after test due to Benadryl .  On the Day of the Test: Drink plenty of water until 1 hour prior to the test. Do not eat any food 1 hour prior to test. You may take your regular medications prior to the test.  Take metoprolol (Lopressor) two hours prior to test. If you take Furosemide /Hydrochlorothiazide/Spironolactone /Chlorthalidone, please HOLD on the morning of the test. Patients who wear a continuous glucose monitor MUST remove the device prior to scanning. FEMALES- please wear underwire-free bra if available, avoid dresses & tight clothing      After the Test: Drink plenty of water. After receiving IV contrast, you may experience a mild flushed feeling. This is normal. On occasion, you may experience a mild rash up to 24 hours after the test. This is not dangerous. If this occurs, you can take Benadryl  25 mg, Zyrtec, Claritin, or Allegra and increase your fluid intake. (Patients taking Tikosyn should avoid Benadryl , and may take Zyrtec, Claritin, or Allegra) If you experience trouble breathing, this can be serious. If it is severe call 911 IMMEDIATELY. If it is mild, please call our office.  We will call to schedule your test 2-4 weeks out understanding that some insurance companies will need an authorization prior to the service being performed.   For more information and frequently asked questions, please visit our website : [email or web address]  For non-scheduling related questions, please  contact the cardiac imaging nurse navigator should you have any questions/concerns: Cardiac Imaging Nurse Navigators Direct Office Dial: [phone]   For scheduling needs, including cancellations and rescheduling, please call Brittany, [phone].  Cardiac CT Angiogram A cardiac CT angiogram is a procedure to look at the heart and the area around the heart. It may be done to help find the cause of chest pains or other symptoms of heart disease. During this procedure, a substance called contrast dye is injected into a vein in the arm. The contrast highlights the blood vessels in the area to be checked. A large X-ray machine (CT scanner), then takes detailed pictures of the heart and the surrounding area. The procedure is also sometimes called a coronary CT angiogram, coronary artery scanning, or CTA. A cardiac CT angiogram allows the health care provider to see how well blood is flowing to and from the heart. The provider will be able to see if there are any problems, such as: Blockage or narrowing of the arteries in the heart. Fluid around the heart. Signs of weakness or disease in the muscles, valves, and tissues of the heart. Tell a health care provider about: Any allergies you have. This is especially important if you have had a previous allergic reaction to medicines, contrast  dye, or iodine . All medicines you are taking, including vitamins, herbs, eye drops, creams, and over-the-counter medicines. Any bleeding problems you have. Any surgeries you have had. Any medical conditions you have, including kidney problems or kidney failure. Whether you are pregnant or may be pregnant. Any anxiety disorders, chronic pain, or other conditions you have. These may increase your stress or prevent you from lying still. Any history of abnormal heart rhythms or heart procedures. What are the risks? Your provider will talk with you about risks. These may include: Bleeding. Infection. Allergic  reactions to medicines or dyes. Damage to other structures or organs. Kidney damage from the contrast dye. Increased risk of cancer from radiation exposure. This risk is low. Talk with your provider about: The risks and benefits of testing. How you can receive the lowest dose of radiation. What happens before the procedure? Wear comfortable clothing and remove any jewelry, glasses, dentures, and hearing aids. Follow instructions from your provider about eating and drinking. These may include: 12 hours before the procedure Avoid caffeine. This includes tea, coffee, soda, energy drinks, and diet pills. Drink plenty of water or other fluids that do not have caffeine in them. Being well hydrated can prevent complications. 4-6 hours before the procedure Stop eating and drinking. This will reduce the risk of nausea from the contrast dye. Ask your provider about changing or stopping your regular medicines. These include: Diabetes medicines. Medicines to treat problems with erections (erectile dysfunction). If you have kidney problems, you may need to receive IV hydration before and after the test. What happens during the procedure?  Hair on your chest may need to be removed so that small sticky patches called electrodes can be placed on your chest. These will transmit information that helps to monitor your heart during the procedure. An IV will be inserted into one of your veins. You might be given a medicine to control your heart rate during the procedure. This will help to ensure that good images are obtained. You will be asked to lie on an exam table. This table will slide in and out of the CT machine during the procedure. Contrast dye will be injected into the IV. You might feel warm, or you may get a metallic taste in your mouth. You may be given medicines to relax or dilate the arteries in your heart. If you are allergic to contrast dyes or iodine  you may be given medicine before the test to  reduce the risk of an allergic reaction. The table that you are lying on will move into the CT machine tunnel for the scan. The person running the machine will give you instructions while the scans are being done. You may be asked to: Keep your arms above your head. Hold your breath for short periods. Stay very still, even if the table is moving. The procedure may vary among providers and hospitals. What can I expect after the procedure? After your procedure, it is common to have: A metallic taste in your mouth from the contrast dye. A feeling of warmth. A headache from the heart medicine. Follow these instructions at home: Take over-the-counter and prescription medicines only as told by your provider. If you are told, drink enough fluid to keep your pee pale yellow. This will help to flush the contrast dye out of your body. Most people can return to their normal activities right after the procedure. Ask your provider what activities are safe for you. It is up to you to get the  results of your procedure. Ask your provider, or the department that is doing the procedure, when your results will be ready. Contact a health care provider if: You have any symptoms of allergy to the contrast dye. These include: Shortness of breath. Rash or hives. A racing heartbeat. You notice a change in your peeing (urination). This information is not intended to replace advice given to you by your health care provider. Make sure you discuss any questions you have with your health care provider. Document Revised: 05/09/2022 Document Reviewed: 05/09/2022 Elsevier Patient Education  2024 [employer].

## 2024-09-10 LAB — BASIC METABOLIC PANEL WITH GFR
BUN/Creatinine Ratio: 19 (ref 12–28)
BUN: 20 mg/dL (ref 8–27)
CO2: 25 mmol/L (ref 20–29)
Calcium: 9.6 mg/dL (ref 8.7–10.3)
Chloride: 100 mmol/L (ref 96–106)
Creatinine, Ser: 1.04 mg/dL — ABNORMAL HIGH (ref 0.57–1.00)
Glucose: 211 mg/dL — ABNORMAL HIGH (ref 70–99)
Potassium: 4.7 mmol/L (ref 3.5–5.2)
Sodium: 140 mmol/L (ref 134–144)
eGFR: 58 mL/min/1.73 — ABNORMAL LOW (ref >59)

## 2024-09-12 ENCOUNTER — Ambulatory Visit (HOSPITAL_BASED_OUTPATIENT_CLINIC_OR_DEPARTMENT_OTHER): Payer: Self-pay | Admitting: Family

## 2024-09-14 ENCOUNTER — Encounter (HOSPITAL_COMMUNITY): Payer: Self-pay

## 2024-09-19 ENCOUNTER — Telehealth (HOSPITAL_COMMUNITY): Payer: Self-pay | Admitting: *Deleted

## 2024-09-19 NOTE — Telephone Encounter (Signed)
 Attempted to call patient regarding upcoming cardiac CT appointment. Left message on voicemail with name and callback number  Larey Brick RN Navigator Cardiac Imaging [facility] Heart [facility] Vascular Services [phone] Office [phone] Cell

## 2024-09-20 ENCOUNTER — Encounter (HOSPITAL_BASED_OUTPATIENT_CLINIC_OR_DEPARTMENT_OTHER): Payer: Self-pay

## 2024-09-20 ENCOUNTER — Ambulatory Visit (HOSPITAL_BASED_OUTPATIENT_CLINIC_OR_DEPARTMENT_OTHER)
Admission: RE | Admit: 2024-09-20 | Discharge: 2024-09-20 | Disposition: A | Source: Ambulatory Visit | Attending: Family

## 2024-09-20 DIAGNOSIS — I10 Essential (primary) hypertension: Secondary | ICD-10-CM | POA: Insufficient documentation

## 2024-09-20 DIAGNOSIS — I5032 Chronic diastolic (congestive) heart failure: Secondary | ICD-10-CM | POA: Insufficient documentation

## 2024-09-20 DIAGNOSIS — E785 Hyperlipidemia, unspecified: Secondary | ICD-10-CM | POA: Diagnosis not present

## 2024-09-20 DIAGNOSIS — Z8249 Family history of ischemic heart disease and other diseases of the circulatory system: Secondary | ICD-10-CM | POA: Diagnosis not present

## 2024-09-20 MED ORDER — NITROGLYCERIN 0.4 MG SL SUBL
0.8000 mg | SUBLINGUAL_TABLET | Freq: Once | SUBLINGUAL | Status: AC
  Administered 2024-09-20: 0.8 mg via SUBLINGUAL

## 2024-09-20 MED ORDER — IOHEXOL 350 MG/ML SOLN
100.0000 mL | Freq: Once | INTRAVENOUS | Status: AC | PRN
  Administered 2024-09-20: 95 mL via INTRAVENOUS

## 2024-09-21 ENCOUNTER — Ambulatory Visit (HOSPITAL_BASED_OUTPATIENT_CLINIC_OR_DEPARTMENT_OTHER)
Admission: RE | Admit: 2024-09-21 | Discharge: 2024-09-21 | Disposition: A | Source: Ambulatory Visit | Attending: Cardiology | Admitting: Cardiology

## 2024-09-21 ENCOUNTER — Other Ambulatory Visit: Payer: Self-pay | Admitting: Cardiology

## 2024-09-21 DIAGNOSIS — R931 Abnormal findings on diagnostic imaging of heart and coronary circulation: Secondary | ICD-10-CM

## 2024-09-21 NOTE — Progress Notes (Signed)
Ffr Order

## 2024-09-23 MED ORDER — ASPIRIN 81 MG PO TBEC: 81.0000 mg | DELAYED_RELEASE_TABLET | ORAL | 3 refills | Status: AC

## 2024-09-23 MED ORDER — ASPIRIN 81 MG PO TBEC: 81.0000 mg | DELAYED_RELEASE_TABLET | ORAL | 3 refills | Status: DC

## 2024-09-23 NOTE — Telephone Encounter (Signed)
-----   Message from Reche GORMAN Finder sent at 09/23/2024 12:59 PM EST ----- Cardiac CT today with moderate stenosis.  By FFR (flow measurement) the arteries still get the good blood flow through them.  No indication for further intervention or testing.  Recommend continue  Repatha 140 mg every 2 weeks to prevent progression.  Recommend start aspirin  EC 81 mg daily.  Can we please request most recent lipid panel from PCP - want to ensure LDL at goal <70. ----- Message ----- From: Interface, Rad Results In Sent: 09/21/2024   7:46 PM EST To: Reche GORMAN Finder, NP

## 2024-09-23 NOTE — Telephone Encounter (Signed)
 09/23/24  1:50 PM Result Note The patient has been notified of the result and verbalized understanding.  All questions (if any) were answered.   Advised the pt to start taking ASA EC 81 mg po daily and we will obtain her most recent lipid results from her PCP, to ensure LDL at goal <70.     Pt states before she starts taking a baby ASA a day, she wanted me to make sure this was safe to take with history of Gastric bypass surgery.  Pt states her Surgeon always advised her to stay away from ASA/NSAIDs with history of gastric bypass.    Ran this by Reche Finder, NP and she advised that if pt prefers, she can take ASA EC 81 mg po three times weekly.  Will endorse this back to the pt.     Pt states she would like to proceed with taking ASA 81 mg po 3 times weekly.  Pt aware I will send this in for enteric coated ASA to her confirmed pharmacy of choice.   Pt aware we will give her a call back after her PCP fax her most recent lipid results, if any further changes need to be made/adjusted to her cholesterol regimen.  Did place a call back to Dr. Yolande PCP office and left a message on his RN's voicemail to fax our office pts most recent lipid results to [phone] ATTN: Reche Finder, NP.  Pt verbalized understanding and agrees with this plan.

## 2024-09-26 ENCOUNTER — Ambulatory Visit: Payer: Self-pay | Admitting: Cardiology

## 2024-09-29 ENCOUNTER — Telehealth: Payer: Self-pay

## 2024-09-29 NOTE — Telephone Encounter (Signed)
 Left message on My Chart with CT Angio results per Dr. Karry note. Routed to PCP.

## 2024-10-17 ENCOUNTER — Other Ambulatory Visit: Payer: Self-pay

## 2024-10-17 DIAGNOSIS — Z79899 Other long term (current) drug therapy: Secondary | ICD-10-CM

## 2024-10-17 DIAGNOSIS — R011 Cardiac murmur, unspecified: Secondary | ICD-10-CM

## 2024-10-17 DIAGNOSIS — R600 Localized edema: Secondary | ICD-10-CM

## 2024-10-18 MED ORDER — FUROSEMIDE 20 MG PO TABS: 20.0000 mg | ORAL_TABLET | Freq: Every day | ORAL | 3 refills | Status: AC
